# Patient Record
Sex: Female | Born: 1995 | Hispanic: Yes | Marital: Married | Smoking: Never smoker
Health system: Southern US, Community
[De-identification: ages and names within clinical notes are randomized; demographics above are authoritative.]

## PROBLEM LIST (undated history)

## (undated) DIAGNOSIS — O24419 Gestational diabetes mellitus in pregnancy, unspecified control: Secondary | ICD-10-CM

## (undated) DIAGNOSIS — Z8759 Personal history of other complications of pregnancy, childbirth and the puerperium: Secondary | ICD-10-CM

## (undated) HISTORY — PX: BREAST SURGERY: SHX581

## (undated) HISTORY — DX: Personal history of other complications of pregnancy, childbirth and the puerperium: Z87.59

## (undated) HISTORY — DX: Gestational diabetes mellitus in pregnancy, unspecified control: O24.419

## (undated) HISTORY — PX: OTHER SURGICAL HISTORY: SHX169

---
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
## 2019-03-04 NOTE — L&D Delivery Note (Signed)
Delivery Note At 1940 a viable female infant was delivered via SVD, presentation: LOA. APGAR: 9, 9; weight pending.   Placenta status: spontaneously delivered intact with gentle cord traction. Fundus firm with massage and Pitocin.   Anesthesia: none Lacerations: left labial-hemostatic Est. Blood Loss (mL): 25 Placenta to LD Complications none Cord ph n/a   Mom to postpartum. Baby to Couplet care / Skin to Skin.    Donette Larry, CNM 12/16/2019 7:59 PM

## 2019-08-25 ENCOUNTER — Other Ambulatory Visit: Payer: Self-pay | Admitting: Nurse Practitioner

## 2019-08-25 DIAGNOSIS — Z363 Encounter for antenatal screening for malformations: Secondary | ICD-10-CM

## 2019-08-25 LAB — OB RESULTS CONSOLE HEPATITIS B SURFACE ANTIGEN: Hepatitis B Surface Ag: NEGATIVE

## 2019-08-25 LAB — OB RESULTS CONSOLE RPR: RPR: NONREACTIVE

## 2019-08-25 LAB — OB RESULTS CONSOLE ABO/RH: RH Type: POSITIVE

## 2019-08-25 LAB — OB RESULTS CONSOLE ANTIBODY SCREEN: Antibody Screen: NEGATIVE

## 2019-08-25 LAB — SICKLE CELL SCREEN: Sickle Cell Screen: NORMAL

## 2019-08-25 LAB — OB RESULTS CONSOLE HIV ANTIBODY (ROUTINE TESTING): HIV: NONREACTIVE

## 2019-08-25 LAB — OB RESULTS CONSOLE GC/CHLAMYDIA
Chlamydia: NEGATIVE
Gonorrhea: NEGATIVE

## 2019-08-25 LAB — CYSTIC FIBROSIS DIAGNOSTIC STUDY: Interpretation-CFDNA:: NEGATIVE

## 2019-08-25 LAB — HEPATITIS C ANTIBODY: HCV Ab: NEGATIVE

## 2019-08-25 LAB — GLUCOSE TOLERANCE, 1 HOUR: Glucose, 1 Hour GTT: 179

## 2019-08-25 LAB — OB RESULTS CONSOLE HGB/HCT, BLOOD
HCT: 31 (ref 29–41)
Hemoglobin: 10.5

## 2019-08-25 LAB — OB RESULTS CONSOLE PLATELET COUNT: Platelets: 296

## 2019-08-25 LAB — OB RESULTS CONSOLE RUBELLA ANTIBODY, IGM: Rubella: IMMUNE

## 2019-08-26 ENCOUNTER — Encounter: Payer: Self-pay | Admitting: *Deleted

## 2019-08-30 ENCOUNTER — Ambulatory Visit: Payer: Self-pay

## 2019-08-30 ENCOUNTER — Other Ambulatory Visit: Payer: Self-pay | Admitting: *Deleted

## 2019-08-30 ENCOUNTER — Ambulatory Visit: Payer: Self-pay | Attending: Obstetrics and Gynecology

## 2019-08-30 ENCOUNTER — Other Ambulatory Visit: Payer: Self-pay | Admitting: Nurse Practitioner

## 2019-08-30 ENCOUNTER — Other Ambulatory Visit: Payer: Self-pay

## 2019-08-30 DIAGNOSIS — Z3687 Encounter for antenatal screening for uncertain dates: Secondary | ICD-10-CM

## 2019-08-30 DIAGNOSIS — O283 Abnormal ultrasonic finding on antenatal screening of mother: Secondary | ICD-10-CM

## 2019-08-30 DIAGNOSIS — Z3689 Encounter for other specified antenatal screening: Secondary | ICD-10-CM

## 2019-08-30 DIAGNOSIS — Z363 Encounter for antenatal screening for malformations: Secondary | ICD-10-CM | POA: Insufficient documentation

## 2019-08-30 DIAGNOSIS — O24419 Gestational diabetes mellitus in pregnancy, unspecified control: Secondary | ICD-10-CM

## 2019-08-30 DIAGNOSIS — Z3A24 24 weeks gestation of pregnancy: Secondary | ICD-10-CM

## 2019-08-30 LAB — GLUCOSE TOLERANCE, 3 HOURS
Glucose 1 Hour: 221
Glucose 2 Hour: 204
Glucose 3 Hour: 153
Glucose Fasting: 100

## 2019-09-01 LAB — CYTOLOGY - PAP: Pap: NEGATIVE

## 2019-09-16 ENCOUNTER — Other Ambulatory Visit: Payer: Self-pay

## 2019-09-16 ENCOUNTER — Encounter: Payer: Self-pay | Admitting: Obstetrics and Gynecology

## 2019-09-16 ENCOUNTER — Other Ambulatory Visit (HOSPITAL_COMMUNITY)
Admission: RE | Admit: 2019-09-16 | Discharge: 2019-09-16 | Disposition: A | Discharge status: Home / Self Care | Payer: Self-pay | Source: Ambulatory Visit | Attending: Obstetrics and Gynecology | Admitting: Obstetrics and Gynecology

## 2019-09-16 ENCOUNTER — Ambulatory Visit (INDEPENDENT_AMBULATORY_CARE_PROVIDER_SITE_OTHER): Payer: Self-pay | Admitting: Obstetrics and Gynecology

## 2019-09-16 VITALS — BP 103/61 | HR 90 | Wt 129.9 lb

## 2019-09-16 DIAGNOSIS — O35EXX0 Maternal care for other (suspected) fetal abnormality and damage, fetal genitourinary anomalies, not applicable or unspecified: Secondary | ICD-10-CM

## 2019-09-16 DIAGNOSIS — N898 Other specified noninflammatory disorders of vagina: Secondary | ICD-10-CM | POA: Insufficient documentation

## 2019-09-16 DIAGNOSIS — O23592 Infection of other part of genital tract in pregnancy, second trimester: Secondary | ICD-10-CM

## 2019-09-16 DIAGNOSIS — Z8759 Personal history of other complications of pregnancy, childbirth and the puerperium: Secondary | ICD-10-CM

## 2019-09-16 DIAGNOSIS — Z603 Acculturation difficulty: Secondary | ICD-10-CM

## 2019-09-16 DIAGNOSIS — O283 Abnormal ultrasonic finding on antenatal screening of mother: Secondary | ICD-10-CM

## 2019-09-16 DIAGNOSIS — O24410 Gestational diabetes mellitus in pregnancy, diet controlled: Secondary | ICD-10-CM | POA: Insufficient documentation

## 2019-09-16 DIAGNOSIS — O98812 Other maternal infectious and parasitic diseases complicating pregnancy, second trimester: Secondary | ICD-10-CM

## 2019-09-16 DIAGNOSIS — N738 Other specified female pelvic inflammatory diseases: Secondary | ICD-10-CM

## 2019-09-16 DIAGNOSIS — O358XX0 Maternal care for other (suspected) fetal abnormality and damage, not applicable or unspecified: Secondary | ICD-10-CM | POA: Insufficient documentation

## 2019-09-16 DIAGNOSIS — Z789 Other specified health status: Secondary | ICD-10-CM

## 2019-09-16 DIAGNOSIS — B9689 Other specified bacterial agents as the cause of diseases classified elsewhere: Secondary | ICD-10-CM

## 2019-09-16 DIAGNOSIS — O24419 Gestational diabetes mellitus in pregnancy, unspecified control: Secondary | ICD-10-CM

## 2019-09-16 DIAGNOSIS — O0992 Supervision of high risk pregnancy, unspecified, second trimester: Secondary | ICD-10-CM

## 2019-09-16 DIAGNOSIS — Z23 Encounter for immunization: Secondary | ICD-10-CM

## 2019-09-16 DIAGNOSIS — O0990 Supervision of high risk pregnancy, unspecified, unspecified trimester: Secondary | ICD-10-CM | POA: Insufficient documentation

## 2019-09-16 DIAGNOSIS — B379 Candidiasis, unspecified: Secondary | ICD-10-CM

## 2019-09-16 DIAGNOSIS — Z3A26 26 weeks gestation of pregnancy: Secondary | ICD-10-CM

## 2019-09-16 HISTORY — DX: Maternal care for other (suspected) fetal abnormality and damage, fetal genitourinary anomalies, not applicable or unspecified: O35.EXX0

## 2019-09-16 HISTORY — DX: Gestational diabetes mellitus in pregnancy, diet controlled: O24.410

## 2019-09-16 LAB — POCT URINALYSIS DIP (DEVICE)
Bilirubin Urine: NEGATIVE
Glucose, UA: NEGATIVE mg/dL
Hgb urine dipstick: NEGATIVE
Ketones, ur: NEGATIVE mg/dL
Leukocytes,Ua: NEGATIVE
Nitrite: NEGATIVE
Protein, ur: NEGATIVE mg/dL
Specific Gravity, Urine: 1.025 (ref 1.005–1.030)
Urobilinogen, UA: 0.2 mg/dL (ref 0.0–1.0)
pH: 7 (ref 5.0–8.0)

## 2019-09-16 NOTE — Progress Notes (Signed)
Pt reports green vaginal discharge. Denies vaginal odor or discomfort. Self-swab obtained.   Fleet Contras RN 09/16/19

## 2019-09-16 NOTE — Patient Instructions (Addendum)
AREA PEDIATRIC/FAMILY PRACTICE PHYSICIANS  [place] ([address]) . [facility] o Chambliss, MD; Eniola, MD; Hale, MD; Hensel, MD; McDiarmid, MD; McIntyer, MD; Neal, MD; Walden, MD o [address] [place], [place] [address] o [phone] o Mon-Fri 8:30-12:30, 1:30-5:00 o Providers come to see babies at [facility] o Accepting Medicaid . [facility] o Limited providers who accept newborns: Koirala, MD; Morrow, MD; Wolters, MD o [address], [place], [place] [address] o [phone] o Mon-Fri 8:00-5:30 o Babies seen by providers at [facility] o Does NOT accept Medicaid o Please call early in hospitalization for appointment (limited availability)  . [facility] o Mulberry, MD o [address] [place], [place] [address] o [phone] o Mon, Tue, Thur, Fri 8:30-5:00, Wed 10:00-7:00 (closed 1-2pm) o Babies seen by [facility] providers o Accepting Medicaid . [facility] - Pediatrician o Rubin, MD o [address], [place], [place] [address] o [phone] o Mon-Fri 8:30-5:00, Sat 8:30-12:00 o Provider comes to see babies at [facility] o Accepting Medicaid o Must have been referred from current patients or contacted office prior to delivery . [facility] ([facility]) o Brown, MD; Chandler, MD; Ettefagh, MD; Grant, MD; Lester, MD; McCormick, MD; McQueen, MD; Prose, MD; Simha, MD; Stanley, MD; Stryffeler, NP; Tebben, NP o [address], [place], [place] [address] o [phone] o Mon, Tue, Thur, Fri 8:30-5:30, Wed 9:30-5:30, Sat 8:30-12:30 o Babies seen by [facility] providers o Accepting Medicaid o Only accepting infants of first-time parents or siblings of current patients o Hospital discharge coordinator will make follow-up appointment . Jack Amos o [address],  [place], [place]  [address] o [phone]   Fax - [phone] . [facility] o [address], [place], [place]  [address] o Phone - [phone]   Fax - [phone] . Shilpa Gosrani o [address], [place], [place]  [address] o [phone]  [place] ([address]) . [facility] o Bates, MD; [place], MD; Cooper, Cox, MD; MD; Davis, MD; Dovico, MD; Ettefaugh, MD; Little, MD; Lowe, MD; Keiffer, MD; Melvin, MD; Sumner, MD; Williams, MD o [address], [place], [place] [address] o [phone] o Mon-Fri 8:30-5:00 (extended evenings Mon-Thur as needed), Sat-Sun 10:00-1:00 o Providers come to see babies at [facility] o Accepting Medicaid for families of first-time babies and families with all children in the household age 3 and under. Must register with office prior to making appointment (M-F only). . [facility] o Henson, NP; Knapp, MD; Lalonde, MD; Tysinger, PA o [address] [place], [place] [address] o [phone] o Mon-Fri 8:00-5:00 o Babies seen by providers at [facility] o Does NOT accept Medicaid/Commercial Insurance Only . [facility])  o Artis, MD; Barnes, MD; Bratton, MD; Coccaro, MD; Lockett Gardner, MD; Kramer, MD; Marshall, MD; Netherton, MD; Poleto, MD; Skinner, MD o [address] [place], [place] [address] o [phone] o Mon-Fri 8:30-5:30, Sat (Oct.-Mar.) 9:00-1:00 o Babies seen by providers at [facility] o Accepting Medicaid  [place] ([address]) . [facility] o Reid, MD; Warner, MD o [address], [place],  [address] o [phone] o Mon-Fri 8:30-5:00, Sat 8:30-12:00 o Providers come to see babies at [facility] o Does NOT accept Medicaid . [facility] o Becker, PA; Hagler, MD; Scifres, PA; Sun, MD; Swayne, MD o [address],  [place], [place] [address] o [phone] o Mon-Fri 8:00-5:00 o Babies seen by providers at [facility] o Does NOT accept Medicaid o Only accepting babies of parents who are patients o Please call early in hospitalization for appointment (limited availability) . [facility] o Clark, MD; Frye, MD; Kelleher, MD; Mack, NP; Miller, MD; O'Keller, MD; Patterson, NP; Pudlo, MD; Puzio, MD; Thomas, MD; Tucker, MD; Twiselton, MD o [address], [place], [place] [address] o [phone] o Mon-Fri 8:00-5:00, Sat 9:00-12:00 o Providers come to see babies at [facility] o Does NOT accept Medicaid  [place] ([address]) . [facility] o Limited providers accepting new patients: Brake, NP; Wharton, PA o [address], [place], [place] [address] o [phone] o Mon-Fri 8:00-5:00 o Babies seen by providers at [facility] o Does NOT accept Medicaid o Only accepting babies of parents who are patients o Please call early in hospitalization for appointment (limited availability) . [facility] o Gay, MD; Quinlan, MD o [address] [place], [place] [address] o [phone] (press 1 to schedule appointment) o Mon-Fri 8:00-5:00 o Providers come to see babies at [facility] o Does NOT accept Medicaid . [facility] o Mazer, MD o [address] [place], [place] [address] o [phone] o Mon-Fri 8:30-5:00 (lunch 12:30-1:00), extended hours by appointment only Wed 5:00-6:30 o Babies seen by [facility] providers o Accepting Medicaid . [facility] o Banks, MD; Jordan, MD; Koberlein, MD o [address], [place], [place] [address] o [phone] o Mon-Fri 8:00-5:00 o Babies seen by [facility] providers o Does NOT accept Medicaid . [facility] o Parker, MD; Hunter, MD; Wallace, DO o [address] [place], [place]  [address] o [phone] o Mon-Fri 8:00-5:00 o Babies seen by [facility] providers o Does NOT accept Medicaid . [facility] o Brandon, PA; Brecken, PA; Christy, NP; Dees, MD; DeClaire, MD; DeWeese, MD; Hansen, NP; Mills, NP; Parrish, NP; Smoot, NP; Summer, MD; Vapne, MD o [address] [place], [place] [address] o [phone] o Mon-Fri 8:30-5:00, Sat 10:00-1:00 o Providers come to see babies at [facility] o Does NOT accept Medicaid o Free prenatal information session Tuesdays at 4:45pm . [facility] o Bouska, MD; Gordon, PA; Jeffery, PA; Weber, PA o [address] [place] [address] o [phone] o Mon-Fri 7:30-5:30 o Babies seen by [facility] providers . [facility] o [address], [place], [place], [place]  [address] o [phone]   Fax - [phone]  [place] ([address]) . [facility] o Reese, MD o [address] [place], [place] [address] o [phone] o Mon-Thur 8:00-6:00 o Providers come to see babies at [facility] o Accepting Medicaid . [facility] o Anderson, NP; Badger, MD; Beal, PA; Spencer, PA o [address] [place],  [address] o [phone] o Mon-Thur 7:30-7:30, Fri 7:30-4:30 o Babies seen by [facility] providers o Accepting Medicaid . [facility] o Agbuya, MD; Klett, NP; Romgoolam, MD o [address], ,  [address] o [phone] o Mon-Fri 8:30-5:00, Sat 8:30-12:00 o Providers come to see babies at [facility] o Accepting Medicaid o Must have "Meet & Greet" appointment at office prior to delivery . [facility]  ([facility] ) o McCord,   MD; Earlene Plater, MD; Lucretia Roers, MD o [address], [place], [place] [address] o [phone] o Mon-Wed 8:00-6:00, Thur-Fri 8:00-5:00, Sat 9:00-12:00 o Providers come to  see babies at [facility] o Does NOT accept Medicaid o Only accepting siblings of current patients . [facility]  o [address], [place], [place]  [address] o [phone]   Fax - [phone] . [facility] o 608 642 [address] [place], [place]  [address] o [phone]   Fax - [phone]  [place] [phone] & [phone]) . Nature conservation officer at [facility] o Ballard, DO; [place], DO o [address] [place], [place] [address] o [phone] o Mon-Fri 7:00-5:00 o Babies seen by [facility] providers o Does NOT accept Medicaid . [facility] Ellis Savage, MD; [place], [place]; [place], [place] o [address], [place], [place] [address] o [phone] o Mon-Fri 8:00-5:00 o Babies seen by [facility] providers o Accepting Medicaid . [facility] [address] Franne Forts, MD; [place], [place]; [place], NP; [place], [place] o [address] [place], [place], [place] [address] o [phone] o Mon-Fri 8:00-5:00 o Babies seen by providers at [facility] o Accepting Medicaid      COVID-19 Vaccine Information can be found at: [email or web address] For questions related to vaccine distribution or appointments, please email [email or web address] or call [phone].

## 2019-09-16 NOTE — Progress Notes (Signed)
INITIAL PRENATAL VISIT NOTE  Subjective:  Andrea Vargas is a 24 y.o. G2P1001 at [redacted]w[redacted]d by 24 week [place] being seen today for her initial prenatal visit. She has an obstetric history significant for 1 x term spontaneous vaginal delivery. She has a medical history significant for n/a.  Transfer from [facility] for GDM.   Patient reports no complaints.  Contractions: Not present. Vag. Bleeding: None.  Movement: Present. Denies leaking of fluid.    Past Medical History:  Diagnosis Date  . History of gestational hypertension     Past Surgical History:  Procedure Laterality Date  . incision and drainage of breast     due to mastitis    OB History  Gravida Para Term Preterm AB Living  2 1 1  0 0 1  SAB TAB Ectopic Multiple Live Births  0 0 0 0 1    # Outcome Date GA Lbr Len/2nd Weight Sex Delivery Anes PTL Lv  2 Current           1 Term 2016   6 lb 9.8 oz (3 kg)  Vag-Spont       Social History   Socioeconomic History  . Marital status: Single    Spouse name: Not on file  . Number of children: Not on file  . Years of education: Not on file  . Highest education level: Not on file  Occupational History  . Not on file  Tobacco Use  . Smoking status: Never Smoker  . Smokeless tobacco: Never Used  Vaping Use  . Vaping Use: Never used  Substance and Sexual Activity  . Alcohol use: Never  . Drug use: Never  . Sexual activity: Not Currently  Other Topics Concern  . Not on file  Social History Narrative  . Not on file   Social Determinants of Health   Financial Resource Strain:   . Difficulty of Paying Living Expenses:   Food Insecurity:   . Worried About 2017 in the Last Year:   . Programme researcher, broadcasting/film/video in the Last Year:   Transportation Needs:   . Barista (Medical):   Freight forwarder Lack of Transportation (Non-Medical):   Physical Activity:   . Days of Exercise per Week:   . Minutes of Exercise per Session:   Stress:   . Feeling of Stress :   Social  Connections:   . Frequency of Communication with Friends and Family:   . Frequency of Social Gatherings with Friends and Family:   . Attends Religious Services:   . Active Member of Clubs or Organizations:   . Attends [employer] Meetings:   Banker Marital Status:     Family History  Problem Relation Age of Onset  . Diabetes Mother      Current Outpatient Medications:  .  Prenatal Vit-Fe Fumarate-FA (PRENATAL VITAMINS PO), Take 1 tablet by mouth daily., Disp: , Rfl:   Not on File  Review of Systems: Negative except for what is mentioned in HPI.  Objective:   Vitals:   09/16/19 0927  BP: 103/61  Pulse: 90  Weight: 129 lb 14.4 oz (58.9 kg)    Fetal Status: Fetal Heart Rate (bpm): 152   Movement: Present     Physical Exam: BP 103/61   Pulse 90   Wt 129 lb 14.4 oz (58.9 kg)   LMP 02/17/2019  CONSTITUTIONAL: Well-developed, well-nourished female in no acute distress.  NEUROLOGIC: Alert and oriented to person, place, and time. Normal  reflexes, muscle tone coordination. No cranial nerve deficit noted. PSYCHIATRIC: Normal mood and affect. Normal behavior. Normal judgment and thought content. SKIN: Skin is warm and dry. No rash noted. Not diaphoretic. No erythema. No pallor. HENT:  Normocephalic, atraumatic, External right and left ear normal. Oropharynx is clear and moist EYES: Conjunctivae and EOM are normal. Pupils are equal, round, and reactive to light. No scleral icterus.  NECK: Normal range of motion, supple, no masses CARDIOVASCULAR: Normal heart rate noted, regular rhythm RESPIRATORY: Effort and breath sounds normal, no problems with respiration noted BREASTS: deferred ABDOMEN: Soft, nondistended, gravid. GU: deferred MUSCULOSKELETAL: Normal range of motion. EXT:  No edema and no tenderness. 2+ distal pulses.   Assessment and Plan:  Pregnancy: G2P1001 at [redacted]w[redacted]d by 24 weeks [place]  1. Supervision of high risk pregnancy, antepartum - Reviewed [facility] [employer] structure, multiple providers, fellows, medical students, virtual visits, MyChart.  - Patient has not had COVID vaccine. Reviewed risks/benefits in pregnancy/breastfeeding and gave info. She will consider and call with questions.  2. Gestational diabetes mellitus (GDM), antepartum, gestational diabetes method of control unspecified Referral to diabetic educator made  3. Vaginal discharge - Cervicovaginal ancillary only( [place])  4. Language barrier Spanish translator used  5. Abnormal fetal ultrasound Dilated left UTD   Preterm labor symptoms and general obstetric precautions including but not limited to vaginal bleeding, contractions, leaking of fluid and fetal movement were reviewed in detail with the patient.  Please refer to After Visit Summary for other counseling recommendations.   Return in about 2 weeks (around 09/30/2019) for high OB, in person.  Conan Bowens 09/16/2019 10:48 AM

## 2019-09-19 LAB — CERVICOVAGINAL ANCILLARY ONLY
Bacterial Vaginitis (gardnerella): POSITIVE — AB
Candida Glabrata: POSITIVE — AB
Candida Vaginitis: NEGATIVE
Chlamydia: NEGATIVE
Comment: NEGATIVE
Comment: NEGATIVE
Comment: NEGATIVE
Comment: NEGATIVE
Comment: NEGATIVE
Comment: NORMAL
Neisseria Gonorrhea: NEGATIVE
Trichomonas: NEGATIVE

## 2019-09-19 MED ORDER — CLOTRIMAZOLE 1 % VA CREA
1.0000 | TOPICAL_CREAM | Freq: Every day | VAGINAL | 2 refills | Status: DC
Start: 2019-09-19 — End: 2019-10-03

## 2019-09-19 MED ORDER — METRONIDAZOLE 500 MG PO TABS
500.0000 mg | ORAL_TABLET | Freq: Two times a day (BID) | ORAL | 0 refills | Status: DC
Start: 2019-09-19 — End: 2019-10-03

## 2019-09-20 ENCOUNTER — Telehealth: Payer: Self-pay

## 2019-09-20 NOTE — Telephone Encounter (Signed)
-----   Message from Granville Lewis, RN sent at 09/19/2019  4:30 PM EDT ----- Karl Luke, Dr Earlene Plater printed this pt's RX because she didn't have a pharmacy listed.  If she sends back a message as to what pharmacy, let me know and I'll fax the RX to them.  Thanks and have a blessed day.

## 2019-09-20 NOTE — Telephone Encounter (Signed)
Called Pt to get the name of her pharmacy using Spanish Interpreter Patricio id # [phone] from [employer], pt stated pharmacy is [facility]. Chart has been updated.

## 2019-09-26 ENCOUNTER — Telehealth: Payer: Self-pay | Admitting: *Deleted

## 2019-09-26 NOTE — Telephone Encounter (Addendum)
-----   Message from Conan Bowens, MD sent at 09/19/2019  2:39 PM EDT ----- +BV and yeast, Rx sent to pharmacy, message sent via mychart, please call and inform her and ask patient what pharmacy she wants me to sent to, Spanish speaking  7/26  1405  Called pt with [facility] interpreter [phone] and informed her of recent test results confirming vaginal infections which are common - not sexually transmitted. Pt stated that she still has the 2 prescriptions which were provided last week. I advised her to take them to her pharmacy and to take/use the medications as directed. Pt voiced understanding and had no questions.

## 2019-09-27 ENCOUNTER — Other Ambulatory Visit: Payer: Self-pay

## 2019-09-27 ENCOUNTER — Ambulatory Visit: Payer: Self-pay

## 2019-09-27 ENCOUNTER — Ambulatory Visit: Payer: Self-pay | Admitting: *Deleted

## 2019-09-27 ENCOUNTER — Other Ambulatory Visit: Payer: Self-pay | Admitting: *Deleted

## 2019-09-27 ENCOUNTER — Ambulatory Visit: Payer: Self-pay | Attending: Obstetrics and Gynecology

## 2019-09-27 VITALS — BP 103/58 | HR 90

## 2019-09-27 DIAGNOSIS — O283 Abnormal ultrasonic finding on antenatal screening of mother: Secondary | ICD-10-CM | POA: Insufficient documentation

## 2019-09-27 DIAGNOSIS — O24410 Gestational diabetes mellitus in pregnancy, diet controlled: Secondary | ICD-10-CM | POA: Insufficient documentation

## 2019-09-27 DIAGNOSIS — O24419 Gestational diabetes mellitus in pregnancy, unspecified control: Secondary | ICD-10-CM

## 2019-09-27 DIAGNOSIS — Z3A28 28 weeks gestation of pregnancy: Secondary | ICD-10-CM

## 2019-09-27 DIAGNOSIS — O0990 Supervision of high risk pregnancy, unspecified, unspecified trimester: Secondary | ICD-10-CM | POA: Insufficient documentation

## 2019-09-27 DIAGNOSIS — Z362 Encounter for other antenatal screening follow-up: Secondary | ICD-10-CM

## 2019-09-27 DIAGNOSIS — O09293 Supervision of pregnancy with other poor reproductive or obstetric history, third trimester: Secondary | ICD-10-CM

## 2019-09-27 DIAGNOSIS — Z3689 Encounter for other specified antenatal screening: Secondary | ICD-10-CM | POA: Insufficient documentation

## 2019-09-28 DIAGNOSIS — O0990 Supervision of high risk pregnancy, unspecified, unspecified trimester: Secondary | ICD-10-CM

## 2019-09-28 DIAGNOSIS — O283 Abnormal ultrasonic finding on antenatal screening of mother: Secondary | ICD-10-CM

## 2019-09-28 DIAGNOSIS — O24419 Gestational diabetes mellitus in pregnancy, unspecified control: Secondary | ICD-10-CM

## 2019-09-29 ENCOUNTER — Ambulatory Visit: Payer: Self-pay | Admitting: Registered"

## 2019-09-29 ENCOUNTER — Encounter: Payer: Self-pay | Attending: Obstetrics and Gynecology | Admitting: Registered"

## 2019-09-29 ENCOUNTER — Encounter: Payer: Self-pay | Admitting: *Deleted

## 2019-09-29 ENCOUNTER — Other Ambulatory Visit: Payer: Self-pay

## 2019-09-29 DIAGNOSIS — Z3A00 Weeks of gestation of pregnancy not specified: Secondary | ICD-10-CM | POA: Insufficient documentation

## 2019-09-29 DIAGNOSIS — O24419 Gestational diabetes mellitus in pregnancy, unspecified control: Secondary | ICD-10-CM | POA: Insufficient documentation

## 2019-09-30 ENCOUNTER — Encounter: Payer: Self-pay | Admitting: *Deleted

## 2019-09-30 NOTE — Progress Notes (Signed)
Interpreter services provided by Azalee Course [phone] from [employer] language services  Patient does not have a car. RD advised patient to call ahead of time to request a ride to appointment if she is not able to arrange. Another barrier to care is arranging childcare.  Patient was seen on 09/30/19 for Gestational Diabetes self-management. EDD 12/20/19. Patient states no history of GDM. Diet history obtained. Patient limited variety of all food groups. Patient has been afraid to eat much food since diagnosis. Beverages include water.    The following learning objectives were met by the patient :   States the definition of Gestational Diabetes  States why dietary management is important in controlling blood glucose  Describes the effects of carbohydrates on blood glucose levels  Demonstrates ability to create a balanced meal plan  Demonstrates carbohydrate counting   States when to check blood glucose levels  Demonstrates proper blood glucose monitoring techniques  States the effect of stress and exercise on blood glucose levels  States the importance of limiting caffeine and abstaining from alcohol and smoking  Plan:  Aim for 3 Carbohydrate Choices per meal (45 grams) +/- 1 either way  Aim for 1-2 Carbohydrate Choices per snack Begin reading food labels for Total Carbohydrate of foods If OK with your MD, consider  increasing your activity level by walking, Arm Chair Exercises or other activity daily as tolerated Begin checking Blood Glucose before breakfast and 2 hours after first bite of breakfast, lunch and dinner as directed by MD  Bring Log Book/Sheet and meter to every medical appointment  Baby Scripts: Patient not appropriate for Baby Scripts due to  Take medication if directed by MD  Blood glucose monitor given: [employer] Lot # [id number] Blood glucose reading: 92 mg/dL (fasting)  Patient instructed to monitor glucose levels: FBS: 60 - 95 mg/dl 2 hour: <120 mg/dl  Patient received  the following handouts:  Nutrition Diabetes and Pregnancy  Carbohydrate Counting List  Blood glucose Log Sheet  Patient will be seen for follow-up in 2 weeks or as needed.

## 2019-10-03 ENCOUNTER — Other Ambulatory Visit: Payer: Self-pay

## 2019-10-03 ENCOUNTER — Ambulatory Visit (INDEPENDENT_AMBULATORY_CARE_PROVIDER_SITE_OTHER): Payer: Self-pay | Admitting: Obstetrics and Gynecology

## 2019-10-03 VITALS — BP 105/61 | HR 95 | Wt 132.4 lb

## 2019-10-03 DIAGNOSIS — O24419 Gestational diabetes mellitus in pregnancy, unspecified control: Secondary | ICD-10-CM

## 2019-10-03 DIAGNOSIS — O0990 Supervision of high risk pregnancy, unspecified, unspecified trimester: Secondary | ICD-10-CM

## 2019-10-03 NOTE — Progress Notes (Deleted)
   PRENATAL VISIT NOTE  Subjective:  Andrea Vargas is a 24 y.o. G2P1001 at [redacted]w[redacted]d being seen today for ongoing prenatal care.  She is currently monitored for the following issues for this {Blank single:19197::"high-risk","low-risk"} pregnancy and has Supervision of high risk pregnancy, antepartum; Gestational diabetes; Abnormal fetal ultrasound; and History of gestational hypertension on their problem list.  Patient reports {sx:14538}.  Contractions: Not present. Vag. Bleeding: None.  Movement: Present. Denies leaking of fluid.   The following portions of the patient's history were reviewed and updated as appropriate: allergies, current medications, past family history, past medical history, past social history, past surgical history and problem list.   Objective:   Vitals:   10/03/19 0847  BP: (!) 105/61  Pulse: 95  Weight: 132 lb 6.4 oz (60.1 kg)    Fetal Status: Fetal Heart Rate (bpm): 154   Movement: Present     General:  Alert, oriented and cooperative. Patient is in no acute distress.  Skin: Skin is warm and dry. No rash noted.   Cardiovascular: Normal heart rate noted  Respiratory: Normal respiratory effort, no problems with respiration noted  Abdomen: Soft, gravid, appropriate for gestational age.  Pain/Pressure: Absent     Pelvic: {Blank single:19197::"Cervical exam performed in the presence of a chaperone","Cervical exam deferred"}        Extremities: Normal range of motion.  Edema: None  Mental Status: Normal mood and affect. Normal behavior. Normal judgment and thought content.   Assessment and Plan:  Pregnancy: G2P1001 at [redacted]w[redacted]d There are no diagnoses linked to this encounter. {Blank single:19197::"Term","Preterm"} labor symptoms and general obstetric precautions including but not limited to vaginal bleeding, contractions, leaking of fluid and fetal movement were reviewed in detail with the patient. Please refer to After Visit Summary for other counseling  recommendations.   No follow-ups on file.  Future Appointments  Date Time Provider Department Center  10/03/2019  8:55 AM Hermina Staggers, MD [facility]  10/13/2019  8:15 AM [facility]  10/25/2019  7:15 AM [facility] NURSE [facility]  10/25/2019  7:30 AM [facility] US3 [facility]    Gita Kudo, MD

## 2019-10-03 NOTE — Progress Notes (Signed)
   PRENATAL VISIT NOTE  Subjective:  Andrea Vargas is a 24 y.o. G2P1001 at [redacted]w[redacted]d being seen today for ongoing prenatal care.  She is currently monitored for the following issues for this high-risk pregnancy and has Supervision of high risk pregnancy, antepartum; Gestational diabetes; Abnormal fetal ultrasound; and History of gestational hypertension on their problem list.  Patient reports no complaints.  Contractions: Not present. Vag. Bleeding: None.  Movement: Present. Denies leaking of fluid.   The following portions of the patient's history were reviewed and updated as appropriate: allergies, current medications, past family history, past medical history, past social history, past surgical history and problem list.   Objective:   Vitals:   10/03/19 0847  BP: (!) 105/61  Pulse: 95  Weight: 132 lb 6.4 oz (60.1 kg)    Fetal Status: Fetal Heart Rate (bpm): 154   Movement: Present     General:  Alert, oriented and cooperative. Patient is in no acute distress.  Skin: Skin is warm and dry. No rash noted.   Cardiovascular: Normal heart rate noted  Respiratory: Normal respiratory effort, no problems with respiration noted  Abdomen: Soft, gravid, appropriate for gestational age.  Pain/Pressure: Absent     Pelvic: Cervical exam deferred        Extremities: Normal range of motion.  Edema: None  Mental Status: Normal mood and affect. Normal behavior. Normal judgment and thought content.   Assessment and Plan:  Pregnancy: G2P1001 at [redacted]w[redacted]d 1. Gestational diabetes mellitus (GDM) in third trimester, gestational diabetes method of control unspecified - reviewed log, all glucose levels are at goal for fasting and postprandial - encouraged to keep up great work w diet control - had 28 week growth, due next at 32 weeks, patient has already scheduled  2. Supervision of high risk pregnancy, antepartum - reviewed all initial labwork (done at ~26 weeks). HCV, HIV neg. Rubella immune. All normal  labs.  - received TDAP already  Discussed Covid Vaccination and patient is interested in obtaining. Provided with resources for finding local free clinic.   Preterm labor symptoms and general obstetric precautions including but not limited to vaginal bleeding, contractions, leaking of fluid and fetal movement were reviewed in detail with the patient. Please refer to After Visit Summary for other counseling recommendations.   Return in about 2 weeks (around 10/17/2019) for rOB.  Future Appointments  Date Time Provider Department Center  10/13/2019  8:15 AM [facility]  10/19/2019  9:55 AM Reva Bores, MD [facility]  10/25/2019  7:15 AM [facility] NURSE [facility]  10/25/2019  7:30 AM [facility] US3 [facility]    Gita Kudo, MD

## 2019-10-03 NOTE — Progress Notes (Signed)
Pt has Glucose readings on Paper. 

## 2019-10-03 NOTE — Patient Instructions (Addendum)
Tercer trimestre de embarazo Third Trimester of Pregnancy  El tercer trimestre comprende desde la semana28 hasta la semana40 (desde el mes7 hasta el mes9). En este trimestre, el beb en gestacin (feto) crece muy rpidamente. Hacia el final del noveno mes, el beb en gestacin mide alrededor de 20pulgadas (45cm) de largo. Pesa entre 6y 10libras (2,70y 4,50kg). Siga estas indicaciones en su casa: Medicamentos  Tome los medicamentos de venta libre y los recetados solamente como se lo haya indicado el mdico. Algunos medicamentos son seguros para tomar durante el embarazo y otros no lo son.  Tome vitaminas prenatales que contengan por lo menos 600microgramos (?g) de cido flico.  Si tiene dificultad para mover el intestino (estreimiento), tome un medicamento para ablandar las heces (laxante) si su mdico se lo autoriza. Comida y bebida   Ingiera alimentos saludables de manera regular.  No coma carne cruda ni quesos sin cocinar.  Si obtiene poca cantidad de calcio de los alimentos que ingiere, consulte a su mdico sobre la posibilidad de tomar un suplemento diario de calcio.  La ingesta diaria de cuatro o cinco comidas pequeas en lugar de tres comidas abundantes.  Evite el consumo de alimentos ricos en grasas y azcares, como los alimentos fritos y los dulces.  Para evitar el estreimiento: ? Consuma alimentos ricos en fibra, como frutas y verduras frescas, cereales integrales y frijoles. ? Beba suficiente lquido para mantener el pis (orina) claro o de color amarillo plido. Actividad  Haga ejercicios solamente como se lo haya indicado el mdico. Interrumpa la actividad fsica si comienza a tener calambres.  No levante objetos pesados, use zapatos de tacones bajos y sintese derecha.  No haga ejercicio si hace demasiado calor, hay demasiada humedad o se encuentra en un lugar de mucha altura (altitud alta).  Puede continuar teniendo relaciones sexuales, a menos que el  mdico le indique lo contrario. Alivio del dolor y del malestar  Use un sostn que le brinde buen soporte si sus mamas estn sensibles.  Haga pausas frecuentes y descanse con las piernas levantadas si tiene calambres en las piernas o dolor en la zona lumbar.  Dese baos de asiento con agua tibia para aliviar el dolor o las molestias causadas por las hemorroides. Use una crema para las hemorroides si el mdico la autoriza.  Si desarrolla venas hinchadas y abultadas (vrices) en las piernas: ? Use medias de compresin o medias de descanso como se lo haya indicado el mdico. ? Levante (eleve) los pies durante 15minutos, 3 o 4veces por da. ? Limite el consumo de sal en sus alimentos. Seguridad  Colquese el cinturn de seguridad cuando conduzca.  Haga una lista de los nmeros de telfono de emergencia, que incluya los nmeros de telfono de familiares, amigos, el hospital, as como los departamentos de polica y bomberos. Preparacin para la llegada del beb Para prepararse para la llegada de su beb:  Tome clases prenatales.  Practique ir manejando al hospital.  Visite el hospital y recorra el rea de maternidad.  Hable en su trabajo acerca de tomar licencia cuando llegue el beb.  Prepare el bolso que llevar al hospital.  Prepare la habitacin del beb.  Concurra a los controles mdicos.  Compre un asiento de seguridad orientado hacia atrs para llevar al beb en el automvil. Aprenda cmo instalarlo en el auto. Instrucciones generales  No se d baos de inmersin en agua caliente, baos turcos ni saunas.  No consuma ningn producto que contenga nicotina o tabaco, como cigarrillos y cigarrillos   electrnicos. Si necesita ayuda para dejar de fumar, consulte al mdico.  No beba alcohol.  No se haga duchas vaginales ni use tampones o toallas higinicas perfumadas.  No mantenga las piernas cruzadas durante mucho tiempo.  No haga viajes de larga distancia, excepto si es  obligatorio. Hgalos solamente si su mdico la autoriza.  Visite a su dentista si no lo ha hecho durante el embarazo. Use un cepillo de cerdas suaves para cepillarse los dientes. Psese el hilo dental con suavidad.  Evite el contacto con las bandejas sanitarias de los gatos y la tierra que estos animales usan. Estos elementos contienen bacterias que pueden causar defectos congnitos al beb y la posible prdida del beb (aborto espontneo) o la muerte fetal.  Concurra a todas las visitas prenatales como se lo haya indicado el mdico. Esto es importante. Comunquese con un mdico si:  No est segura de si est en trabajo de parto o si ha roto la bolsa de las aguas.  Tiene mareos.  Tiene clicos leves o siente presin en la parte baja del vientre.  Sufre un dolor persistente en el abdomen.  Sigue teniendo malestar estomacal, vomita o tiene heces lquidas.  Advierte un lquido con olor ftido que proviene de la vagina.  Siente dolor al orinar. Solicite ayuda de inmediato si:  Tiene fiebre.  Tiene una prdida de lquido por la vagina.  Tiene sangrado o pequeas prdidas vaginales.  Siente dolor intenso o clicos en el abdomen.  Aumenta o baja de peso rpidamente.  Tiene dificultades para recuperar el aliento y siente dolor en el pecho.  Sbitamente se le hinchan mucho el rostro, las manos, los tobillos, los pies o las piernas.  No ha sentido los movimientos del beb durante una hora.  Siente un dolor de cabeza intenso que no se alivia con medicamentos.  Tiene dificultad para ver.  Tiene prdida de lquido o le sale un chorro de lquido de la vagina antes de estar en la semana 37.  Tiene espasmos abdominales (contracciones) regulares antes de estar en la semana 37. Resumen  El tercer trimestre comprende desde la semana28 hasta la semana40 (desde el mes7 hasta el mes9). Esta es la poca en que el beb en gestacin crece muy rpidamente.  Siga los consejos del mdico  con respecto a los medicamentos, la alimentacin y la actividad.  Preprese para la llegada del beb tomando las clases prenatales, preparando todo lo que necesitar el beb, arreglando la habitacin del beb y concurriendo a los controles mdicos.  Solicite ayuda de inmediato si tiene sangrado por la vagina, siente dolor en el pecho o tiene dificultad para respirar, o si no ha sentido que su beb se mueve en el transcurso de ms de una hora. Esta informacin no tiene como fin reemplazar el consejo del mdico. Asegrese de hacerle al mdico cualquier pregunta que tenga. Document Revised: 09/22/2016 Document Reviewed: 09/22/2016 Elsevier Patient Education  2020 [employer]  

## 2019-10-04 ENCOUNTER — Other Ambulatory Visit: Payer: Self-pay | Admitting: *Deleted

## 2019-10-04 DIAGNOSIS — O24419 Gestational diabetes mellitus in pregnancy, unspecified control: Secondary | ICD-10-CM

## 2019-10-10 ENCOUNTER — Encounter: Payer: Self-pay | Admitting: *Deleted

## 2019-10-13 ENCOUNTER — Other Ambulatory Visit: Payer: Self-pay

## 2019-10-19 ENCOUNTER — Ambulatory Visit (INDEPENDENT_AMBULATORY_CARE_PROVIDER_SITE_OTHER): Payer: Self-pay | Admitting: Family Medicine

## 2019-10-19 ENCOUNTER — Other Ambulatory Visit: Payer: Self-pay

## 2019-10-19 VITALS — BP 101/70 | HR 102 | Wt 132.0 lb

## 2019-10-19 DIAGNOSIS — O24410 Gestational diabetes mellitus in pregnancy, diet controlled: Secondary | ICD-10-CM

## 2019-10-19 DIAGNOSIS — O0990 Supervision of high risk pregnancy, unspecified, unspecified trimester: Secondary | ICD-10-CM

## 2019-10-19 NOTE — Patient Instructions (Signed)
 Lactancia materna Breastfeeding  Decidir amamantar es una de las mejores elecciones que puede hacer por usted y su beb. Un cambio en las hormonas durante el embarazo hace que las mamas produzcan leche materna en las glndulas productoras de leche. Las hormonas impiden que la leche materna sea liberada antes del nacimiento del beb. Adems, impulsan el flujo de leche luego del nacimiento. Una vez que ha comenzado a amamantar, pensar en el beb, as como la succin o el llanto, pueden estimular la liberacin de leche de las glndulas productoras de leche. Los beneficios de amamantar Las investigaciones demuestran que la lactancia materna ofrece muchos beneficios de salud para bebs y madres. Adems, ofrece una forma gratuita y conveniente de alimentar al beb. Para el beb  La primera leche (calostro) ayuda a mejorar el funcionamiento del aparato digestivo del beb.  Las clulas especiales de la leche (anticuerpos) ayudan a combatir las infecciones en el beb.  Los bebs que se alimentan con leche materna tambin tienen menos probabilidades de tener asma, alergias, obesidad o diabetes de tipo 2. Adems, tienen menor riesgo de sufrir el sndrome de muerte sbita del lactante (SMSL).  Los nutrientes de la leche materna son mejores para satisfacer las necesidades del beb en comparacin con la leche maternizada.  La leche materna mejora el desarrollo cerebral del beb. Para usted  La lactancia materna favorece el desarrollo de un vnculo muy especial entre la madre y el beb.  Es conveniente. La leche materna es econmica y siempre est disponible a la temperatura correcta.  La lactancia materna ayuda a quemar caloras. Le ayuda a perder el peso ganado durante el embarazo.  Hace que el tero vuelva al tamao que tena antes del embarazo ms rpido. Adems, disminuye el sangrado (loquios) despus del parto.  La lactancia materna contribuye a reducir el riesgo de tener diabetes de tipo 2,  osteoporosis, artritis reumatoide, enfermedades cardiovasculares y cncer de mama, ovario, tero y endometrio en el futuro. Informacin bsica sobre la lactancia Comienzo de la lactancia  Encuentre un lugar cmodo para sentarse o acostarse, con un buen respaldo para el cuello y la espalda.  Coloque una almohada o una manta enrollada debajo del beb para acomodarlo a la altura de la mama (si est sentada). Las almohadas para amamantar se han diseado especialmente a fin de servir de apoyo para los brazos y el beb mientras amamanta.  Asegrese de que la barriga del beb (abdomen) est frente a la suya.  Masajee suavemente la mama. Con las yemas de los dedos, masajee los bordes exteriores de la mama hacia adentro, en direccin al pezn. Esto estimula el flujo de leche. Si la leche fluye lentamente, es posible que deba continuar con este movimiento durante la lactancia.  Sostenga la mama con 4 dedos por debajo y el pulgar por arriba del pezn (forme la letra "C" con la mano). Asegrese de que los dedos se encuentren lejos del pezn y de la boca del beb.  Empuje suavemente los labios del beb con el pezn o con el dedo.  Cuando la boca del beb se abra lo suficiente, acrquelo rpidamente a la mama e introduzca todo el pezn y la arola, tanto como sea posible, dentro de la boca del beb. La arola es la zona de color que rodea al pezn. ? Debe haber ms arola visible por arriba del labio superior del beb que por debajo del labio inferior. ? Los labios del beb deben estar abiertos y extendidos hacia afuera (evertidos) para asegurar   que el beb se prenda de forma adecuada y cmoda. ? La lengua del beb debe estar entre la enca inferior y la mama.  Asegrese de que la boca del beb est en la posicin correcta alrededor del pezn (prendido). Los labios del beb deben crear un sello sobre la mama y estar doblados hacia afuera (invertidos).  Es comn que el beb succione durante 2 a 3 minutos  para que comience el flujo de leche materna. Cmo debe prenderse Es muy importante que le ensee al beb cmo prenderse adecuadamente a la mama. Si el beb no se prende adecuadamente, puede causar dolor en los pezones, reducir la produccin de leche materna y hacer que el beb tenga un escaso aumento de peso. Adems, si el beb no se prende adecuadamente al pezn, puede tragar aire durante la alimentacin. Esto puede causarle molestias al beb. Hacer eructar al beb al cambiar de mama puede ayudarlo a liberar el aire. Sin embargo, ensearle al beb cmo prenderse a la mama adecuadamente es la mejor manera de evitar que se sienta molesto por tragar aire mientras se alimenta. Signos de que el beb se ha prendido adecuadamente al pezn  Tironea o succiona de modo silencioso, sin causarle dolor. Los labios del beb deben estar extendidos hacia afuera (evertidos).  Se escucha que traga cada 3 o 4 succiones una vez que la leche ha comenzado a fluir (despus de que se produzca el reflejo de eyeccin de la leche).  Hay movimientos musculares por arriba y por delante de sus odos al succionar. Signos de que el beb no se ha prendido adecuadamente al pezn  Hace ruidos de succin o de chasquido mientras se alimenta.  Siente dolor en los pezones. Si cree que el beb no se prendi correctamente, deslice el dedo en la comisura de la boca y colquelo entre las encas del beb para interrumpir la succin. Intente volver a comenzar a amamantar. Signos de lactancia materna exitosa Signos del beb  El beb disminuir gradualmente el nmero de succiones o dejar de succionar por completo.  El beb se quedar dormido.  El cuerpo del beb se relajar.  El beb retendr una pequea cantidad de leche en la boca.  El beb se desprender solo del pecho. Signos que presenta usted  Las mamas han aumentado la firmeza, el peso y el tamao 1 a 3 horas despus de amamantar.  Estn ms blandas inmediatamente despus  de amamantar.  Se producen un aumento del volumen de leche y un cambio en su consistencia y color hacia el quinto da de lactancia.  Los pezones no duelen, no estn agrietados ni sangran. Signos de que su beb recibe la cantidad de leche suficiente  Mojar por lo menos 1 o 2paales durante las primeras 24horas despus del nacimiento.  Mojar por lo menos 5 o 6paales cada 24horas durante la primera semana despus del nacimiento. La orina debe ser clara o de color amarillo plido a los 5das de vida.  Mojar entre 6 y 8paales cada 24horas a medida que el beb sigue creciendo y desarrollndose.  Defeca por lo menos 3 veces en 24 horas a los 5 das de vida. Las heces deben ser blandas y amarillentas.  Defeca por lo menos 3 veces en 24 horas a los 7 das de vida. Las heces deben ser grumosas y amarillentas.  No registra una prdida de peso mayor al 10% del peso al nacer durante los primeros 3 das de vida.  Aumenta de peso un promedio de 4   a 7onzas (113 a 198g) por semana despus de los 4 das de vida.  Aumenta de peso, diariamente, de manera uniforme a partir de los 5 das de vida, sin registrar prdida de peso despus de las 2semanas de vida. Despus de alimentarse, es posible que el beb regurgite una pequea cantidad de leche. Esto es normal. Frecuencia y duracin de la lactancia El amamantamiento frecuente la ayudar a producir ms leche y puede prevenir dolores en los pezones y las mamas extremadamente llenas (congestin mamaria). Alimente al beb cuando muestre signos de hambre o si siente la necesidad de reducir la congestin de las mamas. Esto se denomina "lactancia a demanda". Las seales de que el beb tiene hambre incluyen las siguientes:  Aumento del estado de alerta, actividad o inquietud.  Mueve la cabeza de un lado a otro.  Abre la boca cuando se le toca la mejilla o la comisura de la boca (reflejo de bsqueda).  Aumenta las vocalizaciones, tales como sonidos de  succin, se relame los labios, emite arrullos, suspiros o chirridos.  Mueve la mano hacia la boca y se chupa los dedos o las manos.  Est molesto o llora. Evite el uso del chupete en las primeras 4 a 6 semanas despus del nacimiento del beb. Despus de este perodo, podr usar un chupete. Las investigaciones demostraron que el uso del chupete durante el primer ao de vida del beb disminuye el riesgo de tener el sndrome de muerte sbita del lactante (SMSL). Permita que el nio se alimente en cada mama todo lo que desee. Cuando el beb se desprende o se queda dormido mientras se est alimentando de la primera mama, ofrzcale la segunda. Debido a que, con frecuencia, los recin nacidos estn somnolientos las primeras semanas de vida, es posible que deba despertar al beb para alimentarlo. Los horarios de lactancia varan de un beb a otro. Sin embargo, las siguientes reglas pueden servir como gua para ayudarla a garantizar que el beb se alimenta adecuadamente:  Se puede amamantar a los recin nacidos (bebs de 4 semanas o menos de vida) cada 1 a 3 horas.  No deben transcurrir ms de 3 horas durante el da o 5 horas durante la noche sin que se amamante a los recin nacidos.  Debe amamantar al beb un mnimo de 8 veces en un perodo de 24 horas. Extraccin de leche materna     La extraccin y el almacenamiento de la leche materna le permiten asegurarse de que el beb se alimente exclusivamente de su leche materna, aun en momentos en los que no puede amamantar. Esto tiene especial importancia si debe regresar al trabajo en el perodo en que an est amamantando o si no puede estar presente en los momentos en que el beb debe alimentarse. Su asesor en lactancia puede ayudarla a encontrar un mtodo de extraccin que funcione mejor para usted y orientarla sobre cunto tiempo es seguro almacenar leche materna. Cmo cuidar las mamas durante la lactancia Los pezones pueden secarse, agrietarse y doler  durante la lactancia. Las siguientes recomendaciones pueden ayudarla a mantener las mamas humectadas y sanas:  Evite usar jabn en los pezones.  Use un sostn de soporte diseado especialmente para la lactancia materna. Evite usar sostenes con aro o sostenes muy ajustados (sostenes deportivos).  Seque al aire sus pezones durante 3 a 4minutos despus de amamantar al beb.  Utilice solo apsitos de algodn en el sostn para absorber las prdidas de leche. La prdida de un poco de leche materna entre   las tomas es normal.  Utilice lanolina sobre los pezones luego de amamantar. La lanolina ayuda a mantener la humedad normal de la piel. La lanolina pura no es perjudicial (no es txica) para el beb. Adems, puede extraer manualmente algunas gotas de leche materna y masajear suavemente esa leche sobre los pezones para que la leche se seque al aire. Durante las primeras semanas despus del nacimiento, algunas mujeres experimentan congestin mamaria. La congestin mamaria puede hacer que sienta las mamas pesadas, calientes y sensibles al tacto. El pico de la congestin mamaria ocurre en el plazo de los 3 a 5 das despus del parto. Las siguientes recomendaciones pueden ayudarla a aliviar la congestin mamaria:  Vace por completo las mamas al amamantar o extraer leche. Puede aplicar calor hmedo en las mamas (en la ducha o con toallas hmedas para manos) antes de amamantar o extraer leche. Esto aumenta la circulacin y ayuda a que la leche fluya. Si el beb no vaca por completo las mamas cuando lo amamanta, extraiga la leche restante despus de que haya finalizado.  Aplique compresas de hielo sobre las mamas inmediatamente despus de amamantar o extraer leche, a menos que le resulte demasiado incmodo. Haga lo siguiente: ? Ponga el hielo en una bolsa plstica. ? Coloque una toalla entre la piel y la bolsa de hielo. ? Coloque el hielo durante 20minutos, 2 o 3veces por da.  Asegrese de que el beb  est prendido y se encuentre en la posicin correcta mientras lo alimenta. Si la congestin mamaria persiste luego de 48 horas o despus de seguir estas recomendaciones, comunquese con su mdico o un asesor en lactancia. Recomendaciones de salud general durante la lactancia  Consuma 3 comidas y 3 colaciones saludables todos los das. Las madres bien alimentadas que amamantan necesitan entre 450 y 500 caloras adicionales por da. Puede cumplir con este requisito al aumentar la cantidad de una dieta equilibrada que realice.  Beba suficiente agua para mantener la orina clara o de color amarillo plido.  Descanse con frecuencia, reljese y siga tomando sus vitaminas prenatales para prevenir la fatiga, el estrs y los niveles bajos de vitaminas y minerales en el cuerpo (deficiencias de nutrientes).  No consuma ningn producto que contenga nicotina o tabaco, como cigarrillos y cigarrillos electrnicos. El beb puede verse afectado por las sustancias qumicas de los cigarrillos que pasan a la leche materna y por la exposicin al humo ambiental del tabaco. Si necesita ayuda para dejar de fumar, consulte al mdico.  Evite el consumo de alcohol.  No consuma drogas ilegales o marihuana.  Antes de usar cualquier medicamento, hable con el mdico. Estos incluyen medicamentos recetados y de venta libre, como tambin vitaminas y suplementos a base de hierbas. Algunos medicamentos, que pueden ser perjudiciales para el beb, pueden pasar a travs de la leche materna.  Puede quedar embarazada durante la lactancia. Si se desea un mtodo anticonceptivo, consulte al mdico sobre cules son las opciones seguras durante la lactancia. Dnde encontrar ms informacin: Liga internacional [place]eche: www.[email or web address].org. Comunquese con un mdico si:  Siente que quiere dejar de amamantar o se siente frustrada con la lactancia.  Sus pezones estn agrietados o sangran.  Sus mamas estn irritadas, sensibles o  calientes.  Tiene los siguientes sntomas: ? Dolor en las mamas o en los pezones. ? Un rea hinchada en cualquiera de las mamas. ? Fiebre o escalofros. ? Nuseas o vmitos. ? Drenaje de otro lquido distinto de la leche materna desde los pezones.  Sus mamas no   se llenan antes de amamantar al beb para el quinto da despus del parto.  Se siente triste y deprimida.  El beb: ? Est demasiado somnoliento como para comer bien. ? Tiene problemas para dormir. ? Tiene ms de 1 semana de vida y moja menos de 6 paales en un periodo de 24 horas. ? No ha aumentado de peso a los 5 das de vida.  El beb defeca menos de 3 veces en 24 horas.  La piel del beb o las partes blancas de los ojos se vuelven amarillentas. Solicite ayuda de inmediato si:  El beb est muy cansado (letargo) y no se quiere despertar para comer.  Le sube la fiebre sin causa. Resumen  La lactancia materna ofrece muchos beneficios de salud para bebs y madres.  Intente amamantar a su beb cuando muestre signos tempranos de hambre.  Haga cosquillas o empuje suavemente los labios del beb con el dedo o el pezn para lograr que el beb abra la boca. Acerque el beb a la mama. Asegrese de que la mayor parte de la arola se encuentre dentro de la boca del beb. Ofrzcale una mama y haga eructar al beb antes de pasar a la otra.  Hable con su mdico o asesor en lactancia si tiene dudas o problemas con la lactancia. Esta informacin no tiene como fin reemplazar el consejo del mdico. Asegrese de hacerle al mdico cualquier pregunta que tenga. Document Revised: 05/14/2017 Document Reviewed: 06/09/2016 Elsevier Patient Education  2020 [employer]  

## 2019-10-19 NOTE — Progress Notes (Signed)
   PRENATAL VISIT NOTE  Subjective:  Tylena Prisk is a 24 y.o. G2P1001 at [redacted]w[redacted]d being seen today for ongoing prenatal care.  She is currently monitored for the following issues for this high-risk pregnancy and has Supervision of high risk pregnancy, antepartum; Gestational diabetes; Abnormal fetal ultrasound; and History of gestational hypertension on their problem list.  Patient reports no complaints.  Contractions: Not present. Vag. Bleeding: None.  Movement: Present. Denies leaking of fluid.   The following portions of the patient's history were reviewed and updated as appropriate: allergies, current medications, past family history, past medical history, past social history, past surgical history and problem list.   Objective:   Vitals:   10/19/19 1012  BP: 101/70  Pulse: (!) 102  Weight: 132 lb (59.9 kg)    Fetal Status: Fetal Heart Rate (bpm): 143 Fundal Height: 28 cm Movement: Present     General:  Alert, oriented and cooperative. Patient is in no acute distress.  Skin: Skin is warm and dry. No rash noted.   Cardiovascular: Normal heart rate noted  Respiratory: Normal respiratory effort, no problems with respiration noted  Abdomen: Soft, gravid, appropriate for gestational age.  Pain/Pressure: Absent     Pelvic: Cervical exam deferred        Extremities: Normal range of motion.  Edema: None  Mental Status: Normal mood and affect. Normal behavior. Normal judgment and thought content.   Assessment and Plan:  Pregnancy: G2P1001 at [redacted]w[redacted]d 1. Supervision of high risk pregnancy, antepartum Continue prenatal care. Feels breech today   2. Diet controlled gestational diabetes mellitus (GDM) in third trimester Media Information  CBGs are WNL on diet, continue this. Recent growth 43%  Preterm labor symptoms and general obstetric precautions including but not limited to vaginal bleeding, contractions, leaking of fluid and fetal movement were reviewed in detail with the  patient. Please refer to After Visit Summary for other counseling recommendations.   Return in 2 weeks (on 11/02/2019) for [facility], in person.  Future Appointments  Date Time Provider Department Center  10/25/2019  7:15 AM [facility] NURSE [facility]  10/25/2019  7:30 AM [facility] US3 [facility]  11/03/2019 10:35 AM Conan Bowens, MD [facility]  11/03/2019  2:15 PM [facility]  11/17/2019  8:15 AM Constant, Gigi Gin, MD [facility]  11/24/2019  8:15 AM Warden Fillers, MD [facility]  12/01/2019  8:15 AM Warden Fillers, MD [facility]    Reva Bores, MD

## 2019-10-25 ENCOUNTER — Other Ambulatory Visit: Payer: Self-pay

## 2019-10-25 ENCOUNTER — Ambulatory Visit: Payer: Self-pay | Attending: Obstetrics and Gynecology

## 2019-10-25 ENCOUNTER — Ambulatory Visit: Payer: Self-pay | Admitting: *Deleted

## 2019-10-25 ENCOUNTER — Encounter: Payer: Self-pay | Admitting: *Deleted

## 2019-10-25 DIAGNOSIS — Z3A32 32 weeks gestation of pregnancy: Secondary | ICD-10-CM

## 2019-10-25 DIAGNOSIS — O0990 Supervision of high risk pregnancy, unspecified, unspecified trimester: Secondary | ICD-10-CM

## 2019-10-25 DIAGNOSIS — O09293 Supervision of pregnancy with other poor reproductive or obstetric history, third trimester: Secondary | ICD-10-CM

## 2019-10-25 DIAGNOSIS — O24410 Gestational diabetes mellitus in pregnancy, diet controlled: Secondary | ICD-10-CM

## 2019-10-25 DIAGNOSIS — O283 Abnormal ultrasonic finding on antenatal screening of mother: Secondary | ICD-10-CM | POA: Insufficient documentation

## 2019-10-25 DIAGNOSIS — Z363 Encounter for antenatal screening for malformations: Secondary | ICD-10-CM

## 2019-10-25 IMAGING — US US MFM OB FOLLOW-UP
1 series · 14 of 28 positions shown · non-contrast
Comparison: none

[Series 1: us mfm ob follow-up · 45 acquisitions, 14 frames shown]
[im 2/45]
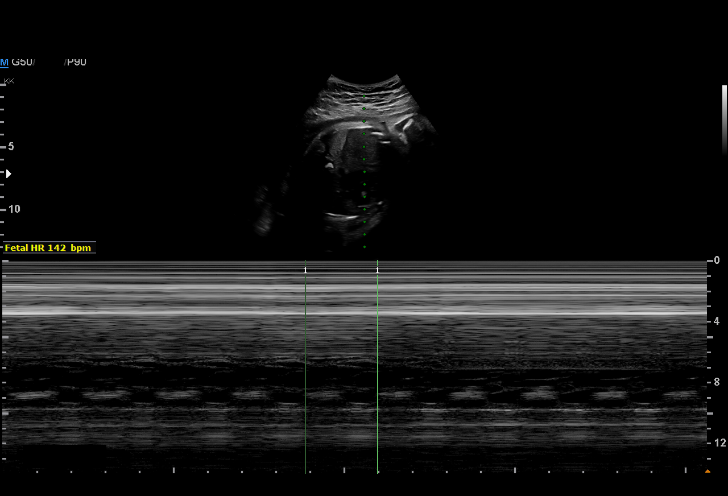
[im 5/45]
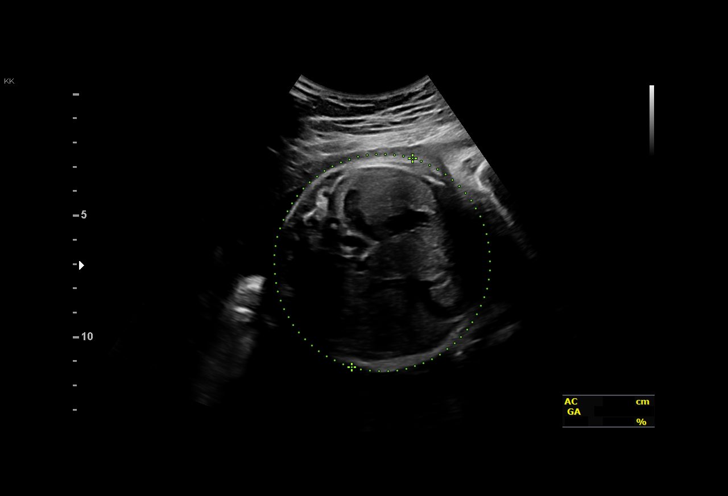
[im 9/45]
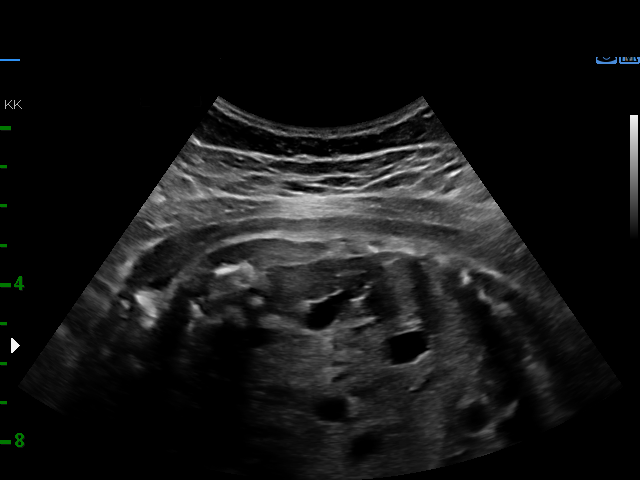
[im 12/45]
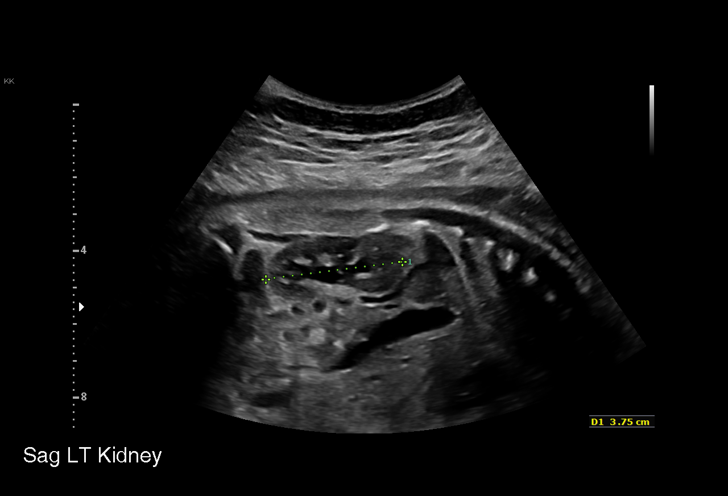
[im 15/45]
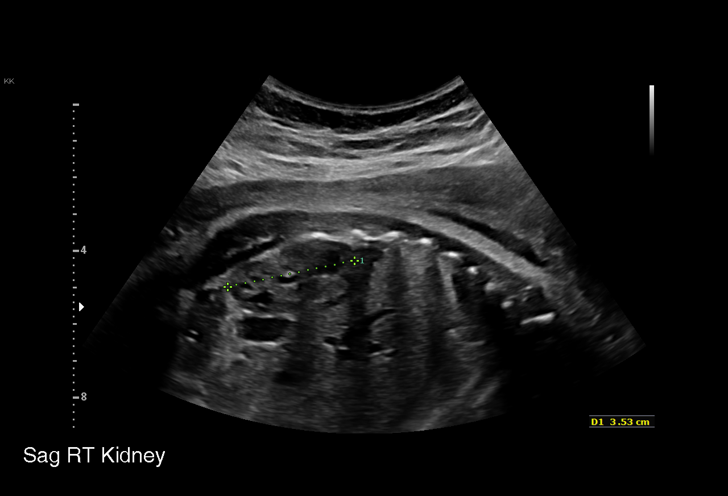
[im 18/45]
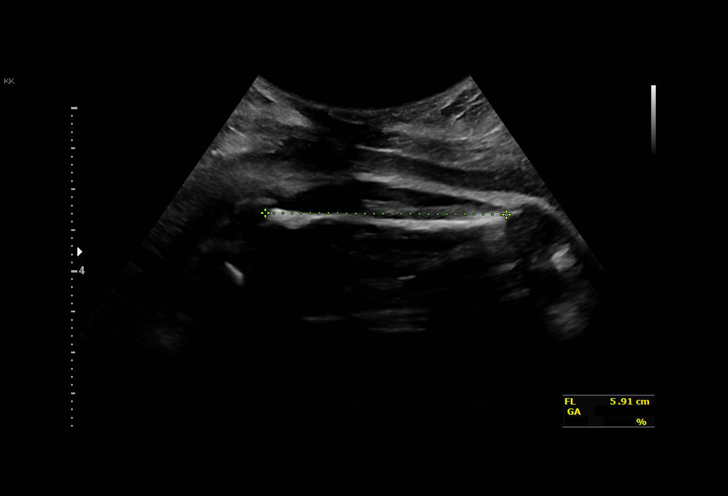
[im 22/45]
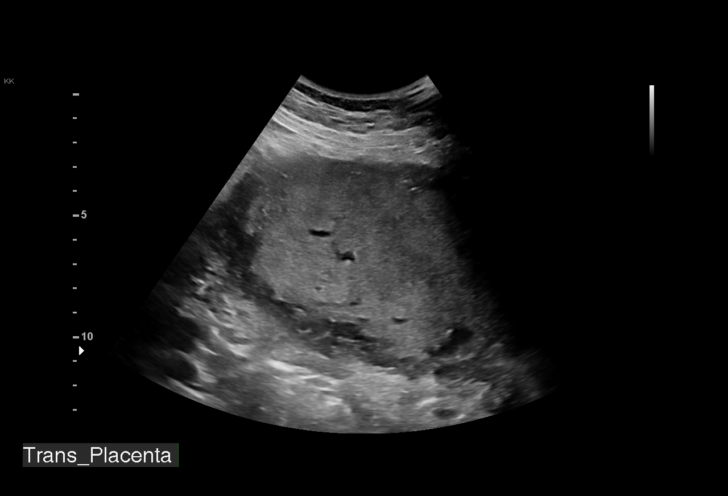
[im 25/45]
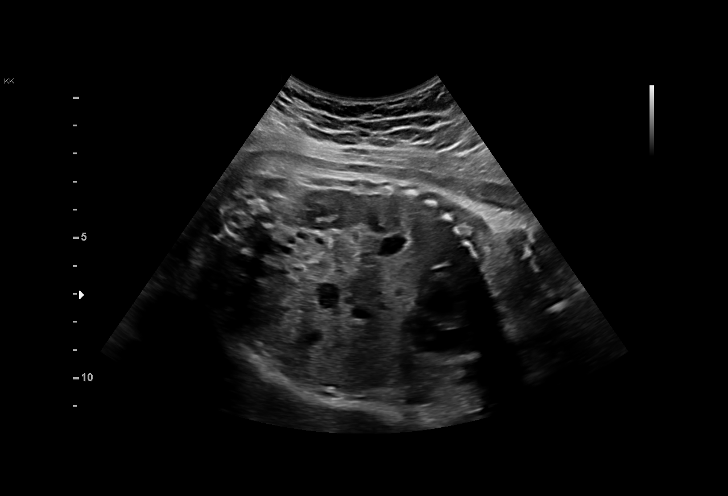
[im 28/45]
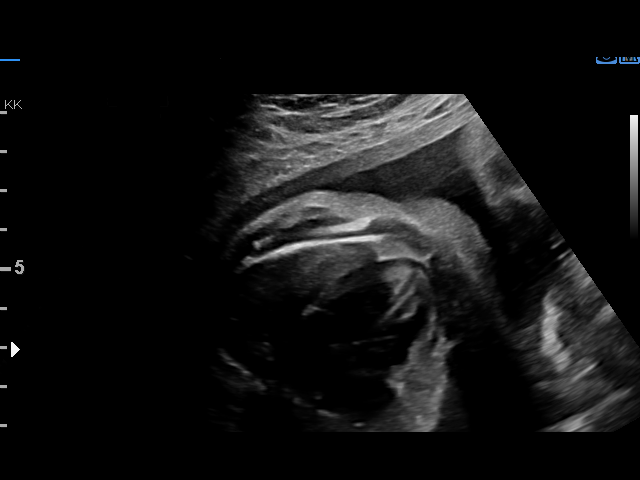
[im 31/45]
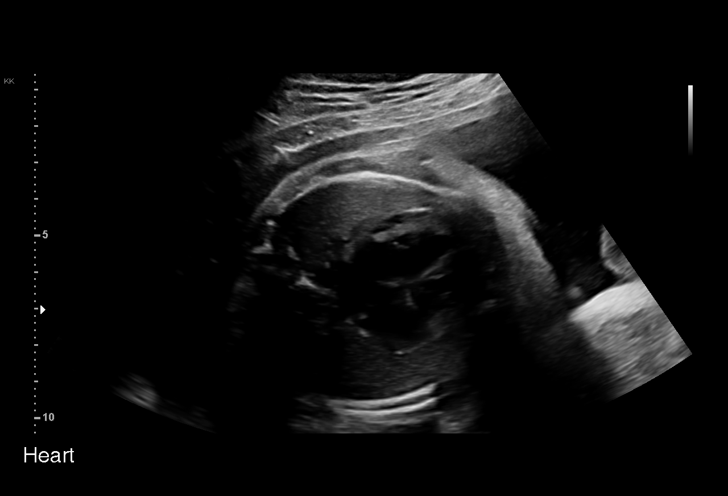
[im 35/45]
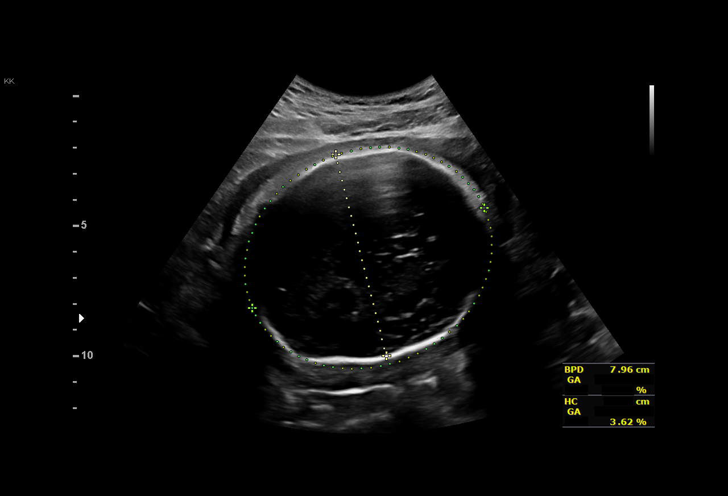
[im 38/45]
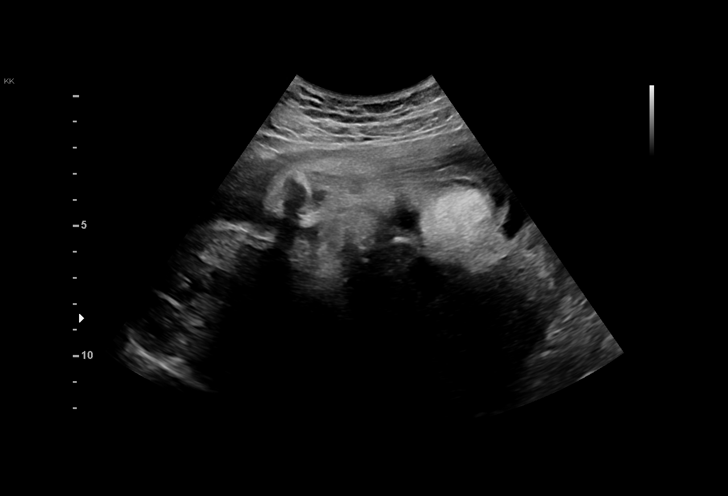
[im 41/45]
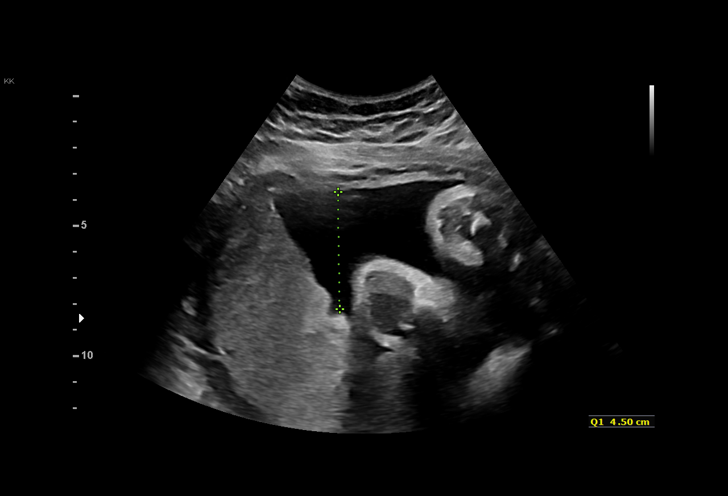
[im 45/45]
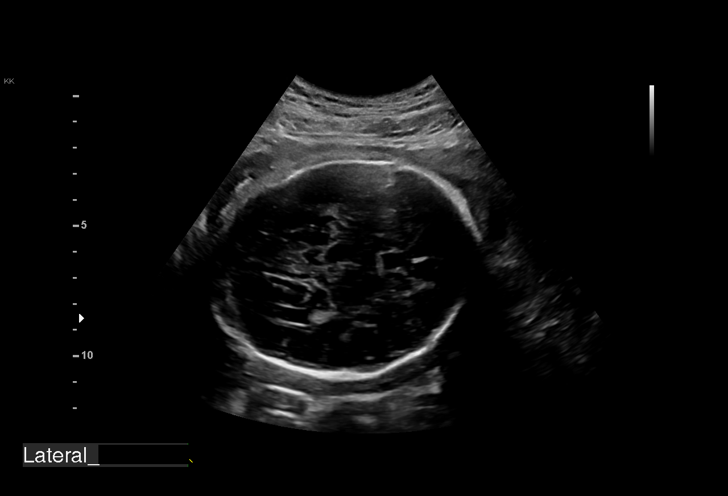

[14 of 28 positions shown; findings below may reference images not displayed]

MORALES

                   [REDACTED]-
                   Faculty Physician

Indications

 Gestational diabetes in pregnancy, diet         [P1]
 controlled
 Poor obstetric history: Previous                [P1]
 preeclampsia / eclampsia/gestational HTN
 32 weeks gestation of pregnancy
 Encounter for other antenatal screening         [P1]
 follow-up
 Pyelectasis of fetus on prenatal ultrasound     [P1]
 (resolved)
 Low Risk NIPS
Fetal Evaluation

 Num Of Fetuses:          1
 Fetal Heart Rate(bpm):   142
 Cardiac Activity:        Observed
 Presentation:            Cephalic
 Placenta:                Posterior
 P. Cord Insertion:       Previously Visualized

 Amniotic Fluid
 AFI FV:      Within normal limits

 AFI Sum(cm)     %Tile       Largest Pocket(cm)
 16.68           60          5.
 RUQ(cm)       RLQ(cm)       LUQ(cm)        LLQ(cm)
 4.5           3.38          5
Biometry

 BPD:      79.3  mm     G. Age:  31w 6d         36  %    CI:        75.32   %    70 - 86
                                                         FL/HC:       20.2  %    19.1 -
 HC:      289.8  mm     G. Age:  31w 6d         13  %    HC/AC:       1.03       0.96 -
 AC:      280.1  mm     G. Age:  32w 0d         50  %    FL/BPD:      73.8  %    71 - 87
 FL:       58.5  mm     G. Age:  30w 4d          8  %    FL/AC:       20.9  %    20 - 24

 Est. FW:    [P1]   gm    3 lb 15 oz     26  %
OB History

 Gravidity:    2         Term:   1        Prem:   0        SAB:   0
 TOP:          0       Ectopic:  0        Living: 1
Gestational Age

 LMP:           35w 5d        Date:  [DATE]                 EDD:   [DATE]
 U/S Today:     31w 4d                                        EDD:   [DATE]
 Best:          32w 0d     Det. By:  U/S  ([DATE])          EDD:   [DATE]
Anatomy

 Cranium:               Appears normal         Aortic Arch:            Previously seen
 Cavum:                 Previously seen        Ductal Arch:            Previously seen
 Ventricles:            Appears normal         Diaphragm:              Appears normal
 Choroid Plexus:        Previously seen        Stomach:                Appears normal, left
                                                                       sided
 Cerebellum:            Previously seen        Abdomen:                Appears normal
 Posterior Fossa:       Previously seen        Abdominal Wall:         Previously seen
 Nuchal Fold:           Not applicable (>20    Cord Vessels:           Previously seen
                        wks GA)
 Face:                  Orbits and profile     Kidneys:                Appear normal
                        previously seen
 Lips:                  Previously seen        Bladder:                Appears normal
 Thoracic:              Appears normal         Spine:                  Previously seen
 Heart:                 Appears normal         Upper Extremities:      Previously seen
                        (4CH, axis, and
                        situs)
 RVOT:                  Previously seen        Lower Extremities:      Previously seen
 LVOT:                  Previously seen

 Other:  Heels and Nasal bone previously visualized.
Cervix Uterus Adnexa

 Cervix
 Not visualized (advanced GA >[P1])
Impression

 Follow up growth due to [P1]
 Normal interval growth with measurements consisent with
 dates
 Good fetal movement and amniotic fluid
Recommendations

 Follow up growth scheduled in 4 weeks.

## 2019-10-27 ENCOUNTER — Other Ambulatory Visit: Payer: Self-pay | Admitting: *Deleted

## 2019-10-27 DIAGNOSIS — O24410 Gestational diabetes mellitus in pregnancy, diet controlled: Secondary | ICD-10-CM

## 2019-11-03 ENCOUNTER — Other Ambulatory Visit: Payer: Self-pay

## 2019-11-03 ENCOUNTER — Ambulatory Visit (INDEPENDENT_AMBULATORY_CARE_PROVIDER_SITE_OTHER): Payer: Self-pay | Admitting: Obstetrics and Gynecology

## 2019-11-03 VITALS — BP 107/73 | HR 93 | Wt 134.0 lb

## 2019-11-03 DIAGNOSIS — Z789 Other specified health status: Secondary | ICD-10-CM

## 2019-11-03 DIAGNOSIS — O0990 Supervision of high risk pregnancy, unspecified, unspecified trimester: Secondary | ICD-10-CM

## 2019-11-03 DIAGNOSIS — Z8759 Personal history of other complications of pregnancy, childbirth and the puerperium: Secondary | ICD-10-CM

## 2019-11-03 DIAGNOSIS — O24410 Gestational diabetes mellitus in pregnancy, diet controlled: Secondary | ICD-10-CM

## 2019-11-03 DIAGNOSIS — Z3A33 33 weeks gestation of pregnancy: Secondary | ICD-10-CM

## 2019-11-03 DIAGNOSIS — O283 Abnormal ultrasonic finding on antenatal screening of mother: Secondary | ICD-10-CM

## 2019-11-03 LAB — POCT URINALYSIS DIP (DEVICE)
Bilirubin Urine: NEGATIVE
Glucose, UA: NEGATIVE mg/dL
Hgb urine dipstick: NEGATIVE
Ketones, ur: NEGATIVE mg/dL
Nitrite: NEGATIVE
Protein, ur: 30 mg/dL — AB
Specific Gravity, Urine: 1.02 (ref 1.005–1.030)
Urobilinogen, UA: 0.2 mg/dL (ref 0.0–1.0)
pH: 7 (ref 5.0–8.0)

## 2019-11-03 NOTE — Progress Notes (Signed)
Error - pt not seen due to symptoms of sore throat and cough Offered virtual visit, pt will reschedule

## 2019-11-15 ENCOUNTER — Ambulatory Visit: Payer: Self-pay | Admitting: Registered"

## 2019-11-15 ENCOUNTER — Other Ambulatory Visit: Payer: Self-pay

## 2019-11-15 ENCOUNTER — Encounter: Payer: Self-pay | Attending: Obstetrics and Gynecology | Admitting: Registered"

## 2019-11-15 ENCOUNTER — Encounter: Payer: Self-pay | Admitting: *Deleted

## 2019-11-15 VITALS — Wt 137.5 lb

## 2019-11-15 DIAGNOSIS — O24419 Gestational diabetes mellitus in pregnancy, unspecified control: Secondary | ICD-10-CM | POA: Insufficient documentation

## 2019-11-15 DIAGNOSIS — O24410 Gestational diabetes mellitus in pregnancy, diet controlled: Secondary | ICD-10-CM

## 2019-11-15 DIAGNOSIS — Z3A00 Weeks of gestation of pregnancy not specified: Secondary | ICD-10-CM | POA: Insufficient documentation

## 2019-11-15 NOTE — Progress Notes (Signed)
Interpreter services provided by Laveda Norman from [facility].  Patient was seen on 11/15/19 for follow-up assessment and education for Gestational Diabetes. EDD 12/20/19. Patient was asked to return for follow-up visit due to patient stated fear of eating after learning she has GDM. It appears she is still restricting but patient states she feels she eats adequately and does not go hungry. Reported diet does not appear adequate and RD encouraged patient to eat more variety and aim to eat protein with each meal. Patient is gaining weight.  Patient is testing blood glucose as directed pre breakfast and 2 hours after each meal. Review of Log sheet shows:  FBS: 70-90 mg/dL and postprandial 15-726 mg/dL  Dietary recall: B: spinach, parsley water (juiced) S: a piece of bread L: 2 tortillas, egg D: 1 1/2 c rice, sometimes beans Bev: a lot of water  10/19/19 132 lb 11/15/19 137.5 lb ~5.5 lb weight gain  The following learning objectives reviewed during follow-up visit:   Blood sugar target ranges and lower does not equal better  Importance of protein during pregnancy  Plan:  . Consider adding more variety and protein to diet.   Patient instructed to monitor glucose levels: FBS: 60 - 95 mg/dl 2 hour: <203 mg/dl  Patient received the following handouts:  Blood sugar log sheet  Patient will be seen for follow-up as needed.

## 2019-11-17 ENCOUNTER — Ambulatory Visit (INDEPENDENT_AMBULATORY_CARE_PROVIDER_SITE_OTHER): Payer: Self-pay | Admitting: Obstetrics and Gynecology

## 2019-11-17 ENCOUNTER — Encounter: Payer: Self-pay | Admitting: Obstetrics and Gynecology

## 2019-11-17 ENCOUNTER — Other Ambulatory Visit: Payer: Self-pay

## 2019-11-17 VITALS — BP 103/68 | HR 93 | Wt 137.0 lb

## 2019-11-17 DIAGNOSIS — Z789 Other specified health status: Secondary | ICD-10-CM

## 2019-11-17 DIAGNOSIS — Z8759 Personal history of other complications of pregnancy, childbirth and the puerperium: Secondary | ICD-10-CM

## 2019-11-17 DIAGNOSIS — O24410 Gestational diabetes mellitus in pregnancy, diet controlled: Secondary | ICD-10-CM

## 2019-11-17 DIAGNOSIS — O0990 Supervision of high risk pregnancy, unspecified, unspecified trimester: Secondary | ICD-10-CM

## 2019-11-17 DIAGNOSIS — Z758 Other problems related to medical facilities and other health care: Secondary | ICD-10-CM

## 2019-11-17 LAB — POCT URINALYSIS DIP (DEVICE)
Bilirubin Urine: NEGATIVE
Glucose, UA: NEGATIVE mg/dL
Ketones, ur: NEGATIVE mg/dL
Leukocytes,Ua: NEGATIVE
Nitrite: NEGATIVE
Protein, ur: NEGATIVE mg/dL
Specific Gravity, Urine: 1.025 (ref 1.005–1.030)
Urobilinogen, UA: 0.2 mg/dL (ref 0.0–1.0)
pH: 7 (ref 5.0–8.0)

## 2019-11-17 NOTE — Progress Notes (Signed)
   PRENATAL VISIT NOTE  Subjective:  Andrea Vargas is a 24 y.o. G2P1001 at [redacted]w[redacted]d being seen today for ongoing prenatal care.  She is currently monitored for the following issues for this high-risk pregnancy and has Supervision of high risk pregnancy, antepartum; Gestational diabetes; Abnormal fetal ultrasound; History of gestational hypertension; and Language barrier on their problem list.  Patient reports no complaints.  Contractions: Not present. Vag. Bleeding: None.  Movement: Present. Denies leaking of fluid.   The following portions of the patient's history were reviewed and updated as appropriate: allergies, current medications, past family history, past medical history, past social history, past surgical history and problem list.   Objective:   Vitals:   11/17/19 0824  BP: 103/68  Pulse: 93  Weight: 137 lb (62.1 kg)    Fetal Status: Fetal Heart Rate (bpm): 143 Fundal Height: 33 cm Movement: Present     General:  Alert, oriented and cooperative. Patient is in no acute distress.  Skin: Skin is warm and dry. No rash noted.   Cardiovascular: Normal heart rate noted  Respiratory: Normal respiratory effort, no problems with respiration noted  Abdomen: Soft, gravid, appropriate for gestational age.  Pain/Pressure: Present     Pelvic: Cervical exam deferred        Extremities: Normal range of motion.  Edema: None  Mental Status: Normal mood and affect. Normal behavior. Normal judgment and thought content.   Assessment and Plan:  Pregnancy: G2P1001 at [redacted]w[redacted]d 1. Supervision of high risk pregnancy, antepartum Patient is doing well without complaints Cultures next visit  2. Diet controlled gestational diabetes mellitus (GDM) in third trimester CBGs reviewed and all within range. Diet controlled Follow up growth ultrasound scheduled 9/21  3. History of gestational hypertension Normotensive   4. Language barrier Spanish interpreter present  Preterm labor symptoms and  general obstetric precautions including but not limited to vaginal bleeding, contractions, leaking of fluid and fetal movement were reviewed in detail with the patient. Please refer to After Visit Summary for other counseling recommendations.   Return in about 1 week (around 11/24/2019) for in person, ROB, High risk.  Future Appointments  Date Time Provider Department Center  11/22/2019  7:30 AM [facility]  11/22/2019  7:45 AM [facility] US4 [facility]  11/24/2019  8:15 AM Warden Fillers, MD [facility]  12/01/2019  8:15 AM Warden Fillers, MD [facility]    Catalina Antigua, MD

## 2019-11-22 ENCOUNTER — Other Ambulatory Visit: Payer: Self-pay

## 2019-11-22 ENCOUNTER — Ambulatory Visit: Payer: Self-pay | Admitting: *Deleted

## 2019-11-22 ENCOUNTER — Encounter: Payer: Self-pay | Admitting: *Deleted

## 2019-11-22 ENCOUNTER — Ambulatory Visit: Payer: Self-pay | Attending: Obstetrics and Gynecology

## 2019-11-22 DIAGNOSIS — Z362 Encounter for other antenatal screening follow-up: Secondary | ICD-10-CM

## 2019-11-22 DIAGNOSIS — O283 Abnormal ultrasonic finding on antenatal screening of mother: Secondary | ICD-10-CM | POA: Insufficient documentation

## 2019-11-22 DIAGNOSIS — O0990 Supervision of high risk pregnancy, unspecified, unspecified trimester: Secondary | ICD-10-CM

## 2019-11-22 DIAGNOSIS — O09293 Supervision of pregnancy with other poor reproductive or obstetric history, third trimester: Secondary | ICD-10-CM

## 2019-11-22 DIAGNOSIS — Z3A36 36 weeks gestation of pregnancy: Secondary | ICD-10-CM

## 2019-11-22 DIAGNOSIS — O24410 Gestational diabetes mellitus in pregnancy, diet controlled: Secondary | ICD-10-CM | POA: Insufficient documentation

## 2019-11-24 ENCOUNTER — Ambulatory Visit (INDEPENDENT_AMBULATORY_CARE_PROVIDER_SITE_OTHER): Payer: Self-pay | Admitting: Obstetrics and Gynecology

## 2019-11-24 ENCOUNTER — Other Ambulatory Visit (HOSPITAL_COMMUNITY)
Admission: RE | Admit: 2019-11-24 | Discharge: 2019-11-24 | Disposition: A | Discharge status: Home / Self Care | Payer: Self-pay | Source: Ambulatory Visit | Attending: Obstetrics and Gynecology | Admitting: Obstetrics and Gynecology

## 2019-11-24 ENCOUNTER — Other Ambulatory Visit: Payer: Self-pay

## 2019-11-24 VITALS — BP 105/66 | HR 77 | Wt 139.5 lb

## 2019-11-24 DIAGNOSIS — O0990 Supervision of high risk pregnancy, unspecified, unspecified trimester: Secondary | ICD-10-CM | POA: Insufficient documentation

## 2019-11-24 DIAGNOSIS — Z789 Other specified health status: Secondary | ICD-10-CM

## 2019-11-24 DIAGNOSIS — Z3A36 36 weeks gestation of pregnancy: Secondary | ICD-10-CM | POA: Insufficient documentation

## 2019-11-24 DIAGNOSIS — O24410 Gestational diabetes mellitus in pregnancy, diet controlled: Secondary | ICD-10-CM

## 2019-11-24 DIAGNOSIS — O283 Abnormal ultrasonic finding on antenatal screening of mother: Secondary | ICD-10-CM

## 2019-11-24 DIAGNOSIS — Z8759 Personal history of other complications of pregnancy, childbirth and the puerperium: Secondary | ICD-10-CM

## 2019-11-24 LAB — POCT URINALYSIS DIP (DEVICE)
Bilirubin Urine: NEGATIVE
Glucose, UA: NEGATIVE mg/dL
Ketones, ur: NEGATIVE mg/dL
Nitrite: NEGATIVE
Protein, ur: NEGATIVE mg/dL
Specific Gravity, Urine: 1.025 (ref 1.005–1.030)
Urobilinogen, UA: 0.2 mg/dL (ref 0.0–1.0)
pH: 6.5 (ref 5.0–8.0)

## 2019-11-24 NOTE — Patient Instructions (Addendum)
Diabetes mellitus gestacional, cuidados personales Gestational Diabetes Mellitus, Self Care Las mujeres que tienen diabetes gestacional (diabetes mellitus gestacional) deben mantener su nivel de azcar en la sangre (glucosa) dentro de un rango saludable. Es posible hacerlo por medio de lo siguiente:  Alimentacin.  Actividad fsica.  Cambios en el estilo de vida.  Medicamentos o insulina, si es necesario.  [employer] mdicos y de Producer, television/film/video. Si recibe tratamiento para esta afeccin, es posible que ni usted ni su beb en gestacin (feto) se vean afectados. Si no recibe tratamiento, esta afeccin puede causar problemas que pueden ser perjudiciales para usted o su beb en gestacin. Si tiene diabetes gestacional:  Es ms probable que vuelva a tenerla si queda embarazada nuevamente.  Es ms probable que desarrolle diabetes tipo2 en el futuro. Cmo mantenerse informada sobre Retail buyer de azcar en la sangre   Controle su nivel de azcar en la sangre todos los das durante el [place]. Haga los controles con la frecuencia que le hayan indicado.  Llame al mdico si el nivel de azcar en la sangre est por encima de las cifras ideales en dosanlisis seguidos. El mdico fijar objetivos de tratamiento personales para usted. Generalmente, los [employer] niveles de Location manager en la sangre deben ser los siguientes:  Antes de las comidas, o despus de no haber comido durante un tiempo prolongado (en ayunas o preprandial): igual o menor que 95 mg/dl (5,3 mmol/l).  Despus de las comidas (posprandial): ? Una hora despus de una comida: igual o menor que 134m/dl (7,844ml/l). ? Dos horas despus de una comida: igual o menor que 12079ml (6,7mm15ml).  Nivel de A1c (hemoglobinaA1c): del 6% al 6,5%. Cmo controlar los niveles altos y bajos de azcaLocation managerla sangre Signos de un nivel alto de azcar en la sangre Un nivel alto de azcar en la sangre se denomina hiperglucemia. Conozca  cules son los signos de un nivel alto de azcaDispensing opticians signos pueden incluir lo siguiente:  Sentir: ? Sed. ? Hambre. ? Mucho cansancio.  Necesidad de orinGarment/textile technologist mayor frecuencia que lo habitual.  Visin borrosa. Signos de un nivel bajo de azcar en la sangre Un nivel bajo de azcar en la sangre se denomina hipoglucemia. Este cuadro ocurre cuando el nivel de azcar en la sangre es igual o menor que 70mg106m(3,9mmol74m. Los signos pueden incluir lo siguiente:  Sentir: ? Hambre. ? Preocupacin o nervios (ansiedad). ? Sudoracin y piel hIntel Corporationnfusin. ? Mareos. ? Somnolencia. ? Ganas de vomitar (nuseas).  Tener: ? Latidos cardacos acelerados. ? Dolor de cabezaNetherlandsmbios en la visin. ? Hormigueo y falta de sensibilidad (entumecimiento) alrededor de la boca, labios o lengua. ? Movimientos espasmdicos que no puede controlar (convulsiones).  Dificultades para hacer lo siguiente: ? Moverse (coordinacin). ? Dormir. ? Desmayos. ? Molestarse con facilidad (irritabilidad). Tratamiento del nivel bajo de azcar en la sangre Para tratar un nivel bajo de azcar en la sangre, ingiera un alimento o una bebida azucarada de inmediato. Si puede pensar con claridad y tragar de manera segura, siga la regla 15/15, que consiste en lo siguiente:  Consuma 15gramos de un hidrato de carbono de accin rpida (carbohidrato). Hable con su mdico acerca de cunto debera consumir.  Algunos hidratos de carbono de accin rpida son: ? Comprimidos de azcar (pastillas de glucosa). Consuma 3o 4pastillas de glucosa. ? De 6 a 8unidades de caramelos duros. ? De 4 a 6onzas (de 120 a 150ml) 16mugo de frutas. ?  De 4 a 6onzas (de 120 a 134m) de refresco comn (no diettico). ? 1 cucharada (163m de miel o azcar.  Contrlese el nivel de azcar en la sangre 1536mtos despus de ingerir el hidrato de carbono.  Si el nivel de azcar en la sangre todava es igual o menor que  20m8m (3,9mmo25m), ingiera nuevamente 15gramos de un hidrato de carbono.  Si el nivel de azcar en la sangre no supera los 20mg/37m3,9mmol/35mdespus de 3intentos, solicite ayuda de inmediato.  Ingiera una comida o una colacin en el transcurso de 1hora despus de que el nivel de azcar en la sangre se haya normalizado. Tratamiento del nivel muy bajo de azcar en la sangre Si el nivel de azcar en la sangre es igual o menor que 54mg/dl48mmol/l)41mignifica que est muy bajo (hipoglucemia grave). Esto es una emergEngineer, maintenance (IT)re a ver si los sntomas desaparecen. Solicite atencin mdica de inmediato. Comunquese con el servicio de emergencias de su localidad (911 en los [place]). Si su nivel de azcar en la sangre es muy bajo y no puede ingerir ningn alimento ni bebida, tal vez deba aplicarse una inyeccin de glucagn. Un familiar o un amigo deben aprender a controlarle el azcar en la sangre y a aplicarle una inyeccin de glucagn. Pregntele al mdico si debe tener un kit de inyecciones de glucagn en su casa. Siga estas indicaciones en su casa: Medicamentos  Aplquese la insulina y tome los medicamentos para la diabetes como se lo hayan indicado.  Si el mdico le indica que se aplique ms o menos insulina, o que tome ms o menos medicamentos, haga exactamente lo que le diga. LandAmerica Financialquede sin insulina o sin medicamentos. Alimentos   Opte por opciones de alimentos saludables. Estos incluyen los siguientes: ? Pollo, pescado, claras de huevos y frijoles. ? Avena, harina integral, trigo burgol, arroz integral, quinua y mijo. ? Frutas y Lambert Modyas frescas. ? Productos lcteos descremados. ? Frutos secos, aguacate, aceite de oliva y aceite de canola.  Consulte a un especialista en alimentacin (nutricionista). Este profesional puede ayudarla a elaborar Paediatric nursede alimentacin adecuado para usted.  Siga las indicaciones del mdico respecto de lo que no puede comer o  beber.  Beba suficiente lquido para mantener Contractororina) de color amarillo plido.  Ingiera refrigerios saludables entre comidas nutritivas.  Lleve un registro de los hidratos de carbono que consume. Para hacerlo, lea las etiquetas de informacin nutricional y aprenda cules son los tamaos de las porciones de los alimentos.  Siga su plan para los das de enfermedad cuando no pueda comer ni beber normalmente. Elabore eUnited Auto el mdico, de modo que est listo para usarlo. Actividad  Haga actividad fsica durante 30minutos34ms por da, o durante el tiempo que el mdico le rViacome.  Hable con el mdico antes de comenzar una rutina de ejercicio o actividad nueva. Es posible que el mdico le indique que haga cambios en: ? La cantidad de insulina qDover Corporation o los medicamentos que toma. ? Cunto debe comer. Estilo de vida  No beba alcohol.  No use productos que contengan tabaco. Estos incluyen cigarrillos, tabaco para mascar y cigarrilloPsychologist, sport and exerciseita ayuda para dejar de fumar, consulte al mdico.  Aprenda cmo sobrellevar el estrs. Si necesita ayuda para lograrlo, consulte al mdico. CuiMeadWestvacodel cuerpo  Mantngase al da con las vacunas (inmunizaciones).  Cepllese los dientes y las encas Rattanlo dental unaArdelia Mems  o ms veces por da.  Vaya al dentista una vez cada 42mses o con ms frecuencia.  Mantenga un peso sTax adviser Indicaciones generales  TDelphide venta libre y los recetados solamente como se lo haya indicado el mdico.  Pregntele al mdico sobre los riesgos de la presin arterial alta en el embarazo (preeclampsia y eclampsia).  Comparta su plan de atencin de la diabetes con: ? Sus compaeros de trabajo o de la escuela. ? LAnadarko Petroleum Corporationcon las que cMayo  Hgase pruebas de orina para dProduct managerpresencia de cetonas: ? Cuando est enferma. ? Como se lo haya indicado el  mdico.  Lleve consigo una tarjeta, o use un brazalete o una medalla que indique que tiene diabetes.  Concurra a todas las visitas de control como se lo haya indicado el mdico. Esto es importante. Cuidados despus del parto  Hgase controlar el nivel de azcar en la sangre 4 a 12semanas despus del parto.  Hgase controlar si tiene diabetes una o ms veces en el plazo de 3 aos. Preguntas para hacerle al mdico  Es necesario que me rena con uRadio broadcast assistanten el cuidado de la diabetes?  Dnde puedo encontrar un grupo de a44para mujeres con diabetes gestacional? Dnde buscar ms informacin Para obtener ms informacin sobre la diabetes, visite los siguientes sitios web:  American Diabetes Association (Asociacin Estadounidense de la Diabetes): www.diabetes.org  Centers for Disease Control and Prevention (Librarian, academic (Centros para eBuilding surveyory la Prevencin de EArboriculturist: whttp://www.wolf.info/Resumen  Controle su nivel de azcar en la sangre (glucosa) tEctor Haga los controles con la frecuencia que le hayan indicado.  Aplquese la insulina y tome los medicamentos para la diabetes como se lo hayan indicado.  Concurra a todas las visitas de control como se lo haya indicado el mdico. Esto es importante.  Hgase controlar el nivel de azcar en la sangre 4 a 12semanas despus del parto. Esta informacin no tiene cMarine scientistel consejo del mdico. Asegrese de hacerle al mdico cualquier pregunta que tenga. Document Revised: 10/01/2017 Document Reviewed: 10/01/2017 Elsevier Patient Education  2020 EElm Creektrimestre de eMedia plannerThird Trimester of Pregnancy  El tercer trimestre comprende desde la sInternational Business Machinesla [id number](desde el mes7 hasta el mes9). En este trimestre, el beb en gestacin (feto) crece muy rpidamente. Hacia el final del noveno mes, el beb en gestacin mide alrededor de 20pulgadas (45cm) de largo. Pesa entre 6y  10libras ([id number]. Siga estas indicaciones en su casa: Medicamentos  TDelphide venta libre y los recetados solamente como se lo haya indicado el mdico. Algunos medicamentos son seguros para tomar durante el eMedia plannery otros no lo son.  Tome vitaminas prenatales que contengan por lo menos [id number](?g) de cido flico.  Si tiene dificultad para mover el intestino (estreimiento), tome un medicamento para ablandar las heces (laxante) si su mdico se lo autoriza. Comida y bebida   Ingiera alimentos saludables de mSt. Peterregular.  No coma carne cruda ni quesos sin cocinar.  Si obtiene poca cantidad de calcio de los alimentos que ingiere, consulte a su mdico sobre la posibilidad de tomar un suplemento diario de calcio.  La ingesta diaria de cuatro o cinco comidas pequeas en lugar de tres comidas abundantes.  Evite el consumo de alimentos ricos en grasas y azcares, como los alimentos fritos y los dulces.  Para evitar el estreimiento: ? Consuma alimentos ricos en fibra, como frutas  y verduras frescas, cereales integrales y frijoles. ? Beba suficiente lquido para mantener el pis (orina) claro o de color amarillo plido. Actividad  Haga ejercicios solamente como se lo haya indicado el mdico. Interrumpa la actividad fsica si comienza a tener calambres.  No levante objetos pesados, use zapatos de tacones bajos y sintese derecha.  No haga ejercicio si hace demasiado calor, hay demasiada humedad o se encuentra en un lugar de mucha altura (altitud alta).  Puede continuar teniendo [employer], a menos que el mdico le indique lo contrario. Alivio del dolor y del Tree surgeon  Use un sostn que le brinde buen soporte si sus mamas estn sensibles.  Haga pausas frecuentes y descanse con las piernas levantadas si tiene calambres en las piernas o dolor en la zona lumbar.  Dese baos de asiento con agua tibia para Best boy o las molestias causadas  por las hemorroides. Use una crema para las hemorroides si el mdico la autoriza.  Si desarrolla venas hinchadas y abultadas (vrices) en las piernas: ? Use medias de compresin o medias de descanso como se lo haya indicado el mdico. ? Levante (eleve) los pies durante 60mnutos, 3 o 4veces por dTraining and development officer ? Limite el consumo de sal en sus alimentos. Seguridad  CMetLifecinturn de seguridad cuando conduzca.  Haga una lista de los nmeros de telfono de eFreight forwarder que iBJ'snmeros de telfono de familiares, amigos, eSolenhospital, as como los departamentos de polica y bomberos. Preparacin para la llegada del beb Para prepararse para la llegada de su beb:  Tome clases prenatales.  Practique ir mGuardian Life Insuranceal hospital.  VLakeland Surgical And Diagnostic Center LLP Florida Campusy recorra el rea de maternidad.  Hable en su trabajo acerca de tomar licencia cuando llegue el beb.  Prepare el bolso que llevar al hospital.  Prepare la habitacin del beb.  Concurra a los controles mdicos.  Compre un asiento de seguridad oAutoNationatrs para llevar al beb en el automvil. Aprenda cmo instalarlo en el auto. Instrucciones generales  No se d baos de inmersin en agua caliente, baos turcos ni saunas.  No consuma ningn producto que contenga nicotina o tabaco, como cigarrillos y cPsychologist, sport and exercise Si necesita ayuda para dejar de fumar, consulte al mdico.  No beba alcohol.  No se haga duchas vaginales ni use tampones o toallas higinicas perfumadas.  No mantenga las piernas cruzadas durante mucho tiempo.  No haga viajes de larga distancia, excepto si es obligatorio. Hgalos solamente si su mdico la autoriza.  Visite a su dentista si no lo ha hQuarry manager Use un cepillo de cerdas suaves para cepillarse los dientes. Psese el hilo dental con suavidad.  Evite el contacto con las bandejas sanitarias de los gatos y la tierra que estos animales usan. Estos elementos contienen bacterias  que pueden causar defectos congnitos al beb y la posible prdida del beb (aborto espontneo) o la muerte fetal.  Concurra a todas las visitas prenatales como se lo haya indicado el mdico. Esto es importante. Comunquese con un mdico si:  No est segura de si est en trabajo de parto o si ha roto la bolsa de las aguas.  Tiene mareos.  Tiene clicos leves o siente presin en la parte baja del vientre.  Sufre un dolor persistente en el abdomen.  Sigue teniendo mGuardian Life Insurance vomita o tiene heces lquidas.  Advierte un lquido con olor ftido que proviene de la vagina.  Siente dolor al oContinental Airlines Solicite ayuda de inmediato si:  Tiene fiebre.  Tiene una prdida de lquido por la vagina.  Tiene sangrado o pequeas prdidas vaginales.  Siente dolor intenso o clicos en el abdomen.  Aumenta o baja de peso rpidamente.  Tiene dificultades para recuperar el aliento y siente dolor en el pecho.  Sbitamente se le hinchan mucho el rostro, las [place], los tobillos, los pies o las piernas.  No ha sentido los movimientos del beb durante Leone Brand.  Siente un dolor de cabeza intenso que no se alivia con medicamentos.  Tiene dificultad para ver.  Tiene prdida de lquido o Chief Financial Officer chorro de lquido de la vagina antes de estar en la semana 37.  Tiene espasmos abdominales (contracciones) regulares antes de estar en la semana 12. Resumen  El tercer trimestre comprende desde la [employer] la [id number] (desde el mes7 hasta el mes9). Esta es la poca en que el beb en gestacin crece muy rpidamente.  Siga los consejos del mdico con respecto a los medicamentos, la alimentacin y [place].  Preprese para la llegada del beb tomando las clases prenatales, preparando todo lo que necesitar el beb, arreglando la habitacin del beb y concurriendo a los controles mdicos.  Solicite ayuda de inmediato si tiene sangrado por la vagina, siente dolor en el pecho o tiene  dificultad para respirar, o si no ha sentido que su beb se mueve en el transcurso de ms de [employer] Corporation. Esta informacin no tiene Marine scientist el consejo del mdico. Asegrese de hacerle al mdico cualquier pregunta que tenga. Document Revised: 09/22/2016 Document Reviewed: 09/22/2016 Elsevier Patient Education  [facility].

## 2019-11-24 NOTE — Progress Notes (Signed)
   PRENATAL VISIT NOTE  Subjective:  Andrea Vargas is a 24 y.o. G2P1001 at [redacted]w[redacted]d being seen today for ongoing prenatal care.  She is currently monitored for the following issues for this high-risk pregnancy and has Supervision of high risk pregnancy, antepartum; Diet controlled gestational diabetes mellitus (GDM) in third trimester; Abnormal fetal ultrasound; History of gestational hypertension; and Language barrier on their problem list.  Patient doing well with no acute concerns today. She reports no complaints.  Contractions: Not present. Vag. Bleeding: None.  Movement: Present. Denies leaking of fluid.   The following portions of the patient's history were reviewed and updated as appropriate: allergies, current medications, past family history, past medical history, past social history, past surgical history and problem list. Problem list updated.  Objective:   Vitals:   11/24/19 0843  BP: 105/66  Pulse: 77  Weight: 139 lb 8 oz (63.3 kg)    Fetal Status: Fetal Heart Rate (bpm): 140 Fundal Height: 36 cm Movement: Present  Presentation: Vertex  General:  Alert, oriented and cooperative. Patient is in no acute distress.  Skin: Skin is warm and dry. No rash noted.   Cardiovascular: Normal heart rate noted  Respiratory: Normal respiratory effort, no problems with respiration noted  Abdomen: Soft, gravid, appropriate for gestational age.  Pain/Pressure: Present     Pelvic: Cervical exam performed Dilation: 1 Effacement (%): 70 Station: -2  Extremities: Normal range of motion.  Edema: None  Mental Status:  Normal mood and affect. Normal behavior. Normal judgment and thought content.   Assessment and Plan:  Pregnancy: G2P1001 at [redacted]w[redacted]d  1. Language barrier interpreter  2. History of gestational hypertension Excellent blood sugar control  3. Abnormal fetal ultrasound   4. Supervision of high risk pregnancy, antepartum  - Strep Gp B NAA - GC/Chlamydia probe amp ([facility])not at [facility]  5. Diet controlled gestational diabetes mellitus (GDM) in third trimester Pt completed growth scan on 9/21, EFW at 31% 5 pounds 13 ounces  6. 36 weeks  Preterm labor symptoms and general obstetric precautions including but not limited to vaginal bleeding, contractions, leaking of fluid and fetal movement were reviewed in detail with the patient.  Please refer to After Visit Summary for other counseling recommendations.   Return in about 1 week (around 12/01/2019) for [facility], in person.   Mariel Aloe, MD

## 2019-11-25 LAB — GC/CHLAMYDIA PROBE AMP (~~LOC~~) NOT AT ARMC
Chlamydia: NEGATIVE
Comment: NEGATIVE
Comment: NORMAL
Neisseria Gonorrhea: NEGATIVE

## 2019-11-26 LAB — STREP GP B NAA: Strep Gp B NAA: POSITIVE — AB

## 2019-12-01 ENCOUNTER — Ambulatory Visit (INDEPENDENT_AMBULATORY_CARE_PROVIDER_SITE_OTHER): Payer: Self-pay | Admitting: Obstetrics and Gynecology

## 2019-12-01 ENCOUNTER — Other Ambulatory Visit: Payer: Self-pay

## 2019-12-01 VITALS — BP 114/72 | HR 76 | Wt 139.8 lb

## 2019-12-01 DIAGNOSIS — Z3A37 37 weeks gestation of pregnancy: Secondary | ICD-10-CM

## 2019-12-01 DIAGNOSIS — Z8759 Personal history of other complications of pregnancy, childbirth and the puerperium: Secondary | ICD-10-CM

## 2019-12-01 DIAGNOSIS — Z789 Other specified health status: Secondary | ICD-10-CM

## 2019-12-01 DIAGNOSIS — O0990 Supervision of high risk pregnancy, unspecified, unspecified trimester: Secondary | ICD-10-CM

## 2019-12-01 DIAGNOSIS — O24410 Gestational diabetes mellitus in pregnancy, diet controlled: Secondary | ICD-10-CM

## 2019-12-01 DIAGNOSIS — Z603 Acculturation difficulty: Secondary | ICD-10-CM

## 2019-12-01 NOTE — Patient Instructions (Signed)
Tercer trimestre de embarazo Third Trimester of Pregnancy  El tercer trimestre comprende desde la semana28 hasta la semana40 (desde el mes7 hasta el mes9). En este trimestre, el beb en gestacin (feto) crece muy rpidamente. Hacia el final del noveno mes, el beb en gestacin mide alrededor de 20pulgadas (45cm) de largo. Pesa entre 6y 10libras (2,70y 4,50kg). Siga estas indicaciones en su casa: Medicamentos  Tome los medicamentos de venta libre y los recetados solamente como se lo haya indicado el mdico. Algunos medicamentos son seguros para tomar durante el embarazo y otros no lo son.  Tome vitaminas prenatales que contengan por lo menos 600microgramos (?g) de cido flico.  Si tiene dificultad para mover el intestino (estreimiento), tome un medicamento para ablandar las heces (laxante) si su mdico se lo autoriza. Comida y bebida   Ingiera alimentos saludables de manera regular.  No coma carne cruda ni quesos sin cocinar.  Si obtiene poca cantidad de calcio de los alimentos que ingiere, consulte a su mdico sobre la posibilidad de tomar un suplemento diario de calcio.  La ingesta diaria de cuatro o cinco comidas pequeas en lugar de tres comidas abundantes.  Evite el consumo de alimentos ricos en grasas y azcares, como los alimentos fritos y los dulces.  Para evitar el estreimiento: ? Consuma alimentos ricos en fibra, como frutas y verduras frescas, cereales integrales y frijoles. ? Beba suficiente lquido para mantener el pis (orina) claro o de color amarillo plido. Actividad  Haga ejercicios solamente como se lo haya indicado el mdico. Interrumpa la actividad fsica si comienza a tener calambres.  No levante objetos pesados, use zapatos de tacones bajos y sintese derecha.  No haga ejercicio si hace demasiado calor, hay demasiada humedad o se encuentra en un lugar de mucha altura (altitud alta).  Puede continuar teniendo relaciones sexuales, a menos que el  mdico le indique lo contrario. Alivio del dolor y del malestar  Use un sostn que le brinde buen soporte si sus mamas estn sensibles.  Haga pausas frecuentes y descanse con las piernas levantadas si tiene calambres en las piernas o dolor en la zona lumbar.  Dese baos de asiento con agua tibia para aliviar el dolor o las molestias causadas por las hemorroides. Use una crema para las hemorroides si el mdico la autoriza.  Si desarrolla venas hinchadas y abultadas (vrices) en las piernas: ? Use medias de compresin o medias de descanso como se lo haya indicado el mdico. ? Levante (eleve) los pies durante 15minutos, 3 o 4veces por da. ? Limite el consumo de sal en sus alimentos. Seguridad  Colquese el cinturn de seguridad cuando conduzca.  Haga una lista de los nmeros de telfono de emergencia, que incluya los nmeros de telfono de familiares, amigos, el hospital, as como los departamentos de polica y bomberos. Preparacin para la llegada del beb Para prepararse para la llegada de su beb:  Tome clases prenatales.  Practique ir manejando al hospital.  Visite el hospital y recorra el rea de maternidad.  Hable en su trabajo acerca de tomar licencia cuando llegue el beb.  Prepare el bolso que llevar al hospital.  Prepare la habitacin del beb.  Concurra a los controles mdicos.  Compre un asiento de seguridad orientado hacia atrs para llevar al beb en el automvil. Aprenda cmo instalarlo en el auto. Instrucciones generales  No se d baos de inmersin en agua caliente, baos turcos ni saunas.  No consuma ningn producto que contenga nicotina o tabaco, como cigarrillos y cigarrillos   electrnicos. Si necesita ayuda para dejar de fumar, consulte al mdico.  No beba alcohol.  No se haga duchas vaginales ni use tampones o toallas higinicas perfumadas.  No mantenga las piernas cruzadas durante mucho tiempo.  No haga viajes de larga distancia, excepto si es  obligatorio. Hgalos solamente si su mdico la autoriza.  Visite a su dentista si no lo ha hecho durante el embarazo. Use un cepillo de cerdas suaves para cepillarse los dientes. Psese el hilo dental con suavidad.  Evite el contacto con las bandejas sanitarias de los gatos y la tierra que estos animales usan. Estos elementos contienen bacterias que pueden causar defectos congnitos al beb y la posible prdida del beb (aborto espontneo) o la muerte fetal.  Concurra a todas las visitas prenatales como se lo haya indicado el mdico. Esto es importante. Comunquese con un mdico si:  No est segura de si est en trabajo de parto o si ha roto la bolsa de las aguas.  Tiene mareos.  Tiene clicos leves o siente presin en la parte baja del vientre.  Sufre un dolor persistente en el abdomen.  Sigue teniendo malestar estomacal, vomita o tiene heces lquidas.  Advierte un lquido con olor ftido que proviene de la vagina.  Siente dolor al orinar. Solicite ayuda de inmediato si:  Tiene fiebre.  Tiene una prdida de lquido por la vagina.  Tiene sangrado o pequeas prdidas vaginales.  Siente dolor intenso o clicos en el abdomen.  Aumenta o baja de peso rpidamente.  Tiene dificultades para recuperar el aliento y siente dolor en el pecho.  Sbitamente se le hinchan mucho el rostro, las manos, los tobillos, los pies o las piernas.  No ha sentido los movimientos del beb durante una hora.  Siente un dolor de cabeza intenso que no se alivia con medicamentos.  Tiene dificultad para ver.  Tiene prdida de lquido o le sale un chorro de lquido de la vagina antes de estar en la semana 37.  Tiene espasmos abdominales (contracciones) regulares antes de estar en la semana 37. Resumen  El tercer trimestre comprende desde la semana28 hasta la semana40 (desde el mes7 hasta el mes9). Esta es la poca en que el beb en gestacin crece muy rpidamente.  Siga los consejos del mdico  con respecto a los medicamentos, la alimentacin y la actividad.  Preprese para la llegada del beb tomando las clases prenatales, preparando todo lo que necesitar el beb, arreglando la habitacin del beb y concurriendo a los controles mdicos.  Solicite ayuda de inmediato si tiene sangrado por la vagina, siente dolor en el pecho o tiene dificultad para respirar, o si no ha sentido que su beb se mueve en el transcurso de ms de una hora. Esta informacin no tiene como fin reemplazar el consejo del mdico. Asegrese de hacerle al mdico cualquier pregunta que tenga. Document Revised: 09/22/2016 Document Reviewed: 09/22/2016 Elsevier Patient Education  2020 Elsevier Inc.  

## 2019-12-01 NOTE — Progress Notes (Signed)
   PRENATAL VISIT NOTE  Subjective:  Andrea Vargas is a 24 y.o. G2P1001 at [redacted]w[redacted]d being seen today for ongoing prenatal care.  She is currently monitored for the following issues for this high-risk pregnancy and has Supervision of high risk pregnancy, antepartum; Diet controlled gestational diabetes mellitus (GDM) in third trimester; Abnormal fetal ultrasound; History of gestational hypertension; Language barrier; [redacted] weeks gestation of pregnancy; and [redacted] weeks gestation of pregnancy on their problem list.  Patient doing well with no acute concerns today. She reports no complaints.  Contractions: Not present. Vag. Bleeding: None.  Movement: Present. Denies leaking of fluid.   FBS:75-94 PPBS: 85-115  The following portions of the patient's history were reviewed and updated as appropriate: allergies, current medications, past family history, past medical history, past social history, past surgical history and problem list. Problem list updated.  Objective:   Vitals:   12/01/19 0825  BP: 114/72  Pulse: 76  Weight: 139 lb 12.8 oz (63.4 kg)    Fetal Status: Fetal Heart Rate (bpm): 146   Movement: Present     General:  Alert, oriented and cooperative. Patient is in no acute distress.  Skin: Skin is warm and dry. No rash noted.   Cardiovascular: Normal heart rate noted  Respiratory: Normal respiratory effort, no problems with respiration noted  Abdomen: Soft, gravid, appropriate for gestational age.  Pain/Pressure: Present     Pelvic: Cervical exam deferred        Extremities: Normal range of motion.  Edema: None  Mental Status:  Normal mood and affect. Normal behavior. Normal judgment and thought content.   Assessment and Plan:  Pregnancy: G2P1001 at [redacted]w[redacted]d  1. Language barrier Interpreter present  2. History of gestational hypertension Blood pressure normal  3. Supervision of high risk pregnancy, antepartum Will schedule IOL  4. Diet controlled gestational diabetes mellitus  (GDM) in third trimester Excellent blood sugar control, schedule IOL at 40 weeks per antenatal guidelines  5. [redacted] weeks gestation of pregnancy   Term labor symptoms and general obstetric precautions including but not limited to vaginal bleeding, contractions, leaking of fluid and fetal movement were reviewed in detail with the patient.  Please refer to After Visit Summary for other counseling recommendations.   Return in about 1 week (around 12/08/2019) for [facility], in person.   Mariel Aloe, MD

## 2019-12-01 NOTE — Addendum Note (Signed)
Addended by: Warden Fillers on: 12/01/2019 12:41 PM   Modules accepted: Orders, SmartSet

## 2019-12-06 ENCOUNTER — Encounter: Payer: Self-pay | Admitting: Lactation Services

## 2019-12-06 DIAGNOSIS — O99820 Streptococcus B carrier state complicating pregnancy: Secondary | ICD-10-CM

## 2019-12-06 HISTORY — DX: Streptococcus B carrier state complicating pregnancy: O99.820

## 2019-12-08 ENCOUNTER — Other Ambulatory Visit: Payer: Self-pay

## 2019-12-08 ENCOUNTER — Encounter: Payer: Self-pay | Admitting: Obstetrics and Gynecology

## 2019-12-08 ENCOUNTER — Ambulatory Visit (INDEPENDENT_AMBULATORY_CARE_PROVIDER_SITE_OTHER): Payer: Self-pay | Admitting: Obstetrics and Gynecology

## 2019-12-08 VITALS — BP 114/80 | HR 88

## 2019-12-08 DIAGNOSIS — Z789 Other specified health status: Secondary | ICD-10-CM

## 2019-12-08 DIAGNOSIS — O24410 Gestational diabetes mellitus in pregnancy, diet controlled: Secondary | ICD-10-CM

## 2019-12-08 DIAGNOSIS — Z758 Other problems related to medical facilities and other health care: Secondary | ICD-10-CM

## 2019-12-08 DIAGNOSIS — O0990 Supervision of high risk pregnancy, unspecified, unspecified trimester: Secondary | ICD-10-CM

## 2019-12-08 DIAGNOSIS — O99820 Streptococcus B carrier state complicating pregnancy: Secondary | ICD-10-CM

## 2019-12-08 NOTE — Progress Notes (Signed)
Spanish Interpreter Eda R. 

## 2019-12-08 NOTE — Patient Instructions (Signed)
Parto vaginal  Vaginal Delivery    Parto vaginal significa que usted da a Kamelia empujando al bebé fuera del canal del parto (vagina). Un equipo de proveedores de atención médica la ayudará antes, durante y después del parto vaginal. Las experiencias de los nacimientos son únicas para todas las mujeres, y cada embarazo y las experiencias de nacimiento varían según dónde elija dar a Kaelee.  ¿Qué ocurrirá cuando llegue al centro de parto o al hospital?  Una vez que se inicie el trabajo de parto y haya sido admitida en el hospital o centro de parto, el médico podrá hacer lo siguiente:  · Revisar sus antecedentes de embarazo y cualquier inquietud que usted pueda tener.  · Colocarle una vía intravenosa en una de las venas. Esto se podrá usar para administrarle líquidos y medicamentos.  · Verificar su presión arterial, pulso, temperatura y frecuencia cardíaca (signos vitales).  · Verificar si la bolsa de agua (saco amniótico) se ha roto (ruptura).  · Hablar con usted sobre su plan de nacimiento y analizar las opciones para controlar el dolor.  Monitoreo  Su médico puede monitorear las contracciones (monitoreo uterino) y la frecuencia cardíaca del bebé (monitoreo fetal). Es posible que el monitoreo se necesite realizar:  · Con frecuencia, pero no continuamente (intermitentemente).  · Todo el tiempo o durante largos períodos a la vez (continuamente). El monitoreo continuo puede ser necesario si:  ? Está recibiendo determinados medicamentos, tales como medicamentos para aliviar el dolor o para hacer que las contracciones sean más fuertes.  ? Tiene complicaciones durante el embarazo o el trabajo de parto.  El monitoreo se puede realizar:  · Al colocar un estetoscopio especial o un dispositivo manual de monitoreo en el abdomen o verificar los latidos cardíacos del bebé y comprobar las contracciones.  · Al colocar monitores en el abdomen (monitores externos) para registrar los latidos cardíacos del bebé y la frecuencia y duración de  las contracciones.  · Al colocar monitores dentro del útero a través de la vagina (monitores internos) para registrar los latidos cardíacos del bebé y la frecuencia, duración y fuerza de sus contracciones. Según el tipo de monitor, puede permanecer en el útero o en la cabeza del bebé hasta el nacimiento.  · Telemetría. Se trata de un tipo de monitoreo continuo que se puede realizar con monitores externos o internos. En lugar de tener que permanecer en la cama, usted puede moverse durante la telemetría.  Examen físico  Su médico puede realizar exámenes físicos frecuentes. Esto puede incluir lo siguiente:  · Verificar cómo y dónde el bebé está ubicado en el útero.  · Verificar el cuello uterino para determinar:  ? Si se está afinando o estirando (borrando).  ? Si se está abriendo (dilatando).  ¿Qué sucede durante el trabajo de parto y el parto?    El trabajo de parto y el parto normales se dividen en tres etapas:  Etapa 1  · Esta es la etapa más larga del trabajo de parto.  · Esta etapa puede durar horas o días.  · Durante esta etapa, sentirá contracciones. En general, las contracciones son leves, infrecuentes e irregulares al principio. Se hacen más fuertes, más frecuentes (aproximadamente cada 2 o 3 minutos) y más regulares a medida que avanza en esta etapa.  · Esta etapa finaliza cuando el cuello uterino está completamente dilatado hasta 4 pulgadas (10 cm) y completamente borrado.  Etapa 2  · Esta etapa comienza una vez que el cuello uterino está totalmente borrado y dilatado,   vagina.  Puede sentir un dolor urente y por estiramiento, especialmente cuando la parte ms ancha de la cabeza del beb pasa a travs de la abertura vaginal (coronacin).  Una vez que el beb nace, el cordn umbilical se pinzar y se cortar. Esto ocurre por lo general despus  de un perodo de 1 a 2 minutos despus del parto.  Colocarn al beb sobre su pecho desnudo (contacto piel con piel) en una posicin erguida y [place] con [place] Pita abrigada. Observe al beb para detectar seales de hambre, como el reflejo de bsqueda o succin, y acrquelo al pecho para su primera alimentacin. Etapa 3  Esta etapa comienza inmediatamente despus del nacimiento del beb y finaliza despus de la expulsin de la placenta.  Esta etapa puede durar de 5 a 30 minutos.  Despus del nacimiento del beb, puede sentir contracciones cuando el cuerpo expulsa la placenta y el tero se contrae para Radio broadcast assistant. Qu puedo esperar despus del Aleen Campi de parto y [place]?  Una vez que termine el trabajo de [place], se los controlar a usted y al beb atentamente para Warehouse manager la seguridad de que ambos estn sanos y listos para ir a Higher education careers adviser. Su equipo de atencin Art gallery manager cmo cuidarse y cuidar a su beb.  Usted y el beb permanecern en la misma habitacin (cohabitacin) durante su estada en el hospital. Esto estimular una vinculacin temprana y [place].  Puede seguir recibiendo lquidos o medicamentos por va intravenosa.  Se le controlar y Engineer, maintenance (IT) el tero con regularidad (masaje fndico).  Tendr algo de inflamacin y dolor en el abdomen, la vagina y la zona de la piel entre la abertura vaginal y el ano (perineo).  Si se le realiz una incisin cerca de la vagina (episiotoma) o si ha tenido Airline pilot parto, podran indicarle que se coloque compresas fras sobre la episiotoma o Art therapist. Esto ayuda a Engineer, materials y la hinchazn.  Es posible que le den una botella rociadora para que use cuando vaya al bao para higienizarse. Siga los pasos a continuacin para usar la botella rociadora: ? Antes de orinar, llene la botella rociadora con agua tibia. No use agua caliente. ? Despus de Geographical information systems officer, [place] an est sentada en el inodoro,  use la botella rociadora para enjuagar el rea alrededor de la uretra y la abertura vaginal. Con esto podr limpiar cualquier rastro de orina y [place]. ? Llene la botella rociadora con agua limpia cada vez que vaya al bao.  Es normal tener hemorragia vaginal despus del [place]. Use un apsito sanitario para el sangrado vaginal y secrecin. Resumen  Parto vaginal significa que usted dar a Lilliann empujando al beb fuera del canal del parto (vagina).  Su mdico puede monitorear las contracciones (monitoreo uterino) y la frecuencia cardaca del beb (monitoreo fetal).  Su mdico puede realizarle un examen fsico.  El trabajo de parto y el parto normales se dividen en tres etapas.  Una vez que termina el [place] de [place], se los controlar a usted y al beb atentamente hasta que estn listos para ir a casa. Esta informacin no tiene Theme park manager el consejo del mdico. Asegrese de hacerle al mdico cualquier pregunta que tenga. Document Revised: 04/29/2017 Document Reviewed: 04/29/2017 Elsevier Patient Education  2020 [employer]. Systems analyst trimestre de Psychiatrist Third Trimester of Pregnancy  El tercer trimestre comprende desde la [employer] la semana40 (desde el mes7 hasta el mes9). En este trimestre, el beb en gestacin (  feto) crece muy rpidamente. Hacia el final del noveno mes, el beb en gestacin mide alrededor de 20pulgadas (45cm) de largo. Pesa entre 6y 10libras [phone]). Siga estas indicaciones en su casa: Medicamentos  [employer] de venta libre y los recetados solamente como se lo haya indicado el mdico. Algunos medicamentos son seguros para tomar durante el Psychiatrist y otros no lo son.  Tome vitaminas prenatales que contengan por lo menos (?g) de cido flico.  Si tiene dificultad para mover el intestino (estreimiento), tome un medicamento para ablandar las heces (laxante) si su mdico se lo autoriza. Comida y bebida   Ingiera  alimentos saludables de [place] regular.  No coma carne cruda ni quesos sin cocinar.  Si obtiene poca cantidad de calcio de los alimentos que ingiere, consulte a su mdico sobre la posibilidad de tomar un suplemento diario de calcio.  La ingesta diaria de cuatro o cinco comidas pequeas en lugar de tres comidas abundantes.  Evite el consumo de alimentos ricos en grasas y azcares, como los alimentos fritos y los dulces.  Para evitar el estreimiento: ? Consuma alimentos ricos en fibra, como frutas y verduras frescas, cereales integrales y frijoles. ? Beba suficiente lquido para mantener el pis (orina) claro o de color amarillo plido. Actividad  Haga ejercicios solamente como se lo haya indicado el mdico. Interrumpa la actividad fsica si comienza a tener calambres.  No levante objetos pesados, use zapatos de tacones bajos y sintese derecha.  No haga ejercicio si hace demasiado calor, hay demasiada humedad o se encuentra en un lugar de mucha altura (altitud alta).  Puede continuar teniendo [employer], a menos que el mdico le indique lo contrario. Alivio del dolor y del Dentist  Use un sostn que le brinde buen soporte si sus mamas estn sensibles.  Haga pausas frecuentes y descanse con las piernas levantadas si tiene calambres en las piernas o dolor en la zona lumbar.  Dese baos de asiento con agua tibia para Engineer, materials o las molestias causadas por las hemorroides. Use una crema para las hemorroides si el mdico la autoriza.  Si desarrolla venas hinchadas y abultadas (vrices) en las piernas: ? Use medias de compresin o medias de descanso como se lo haya indicado el mdico. ? Levante (eleve) los pies durante , 3 o 4veces por Futures trader. ? Limite el consumo de sal en sus alimentos. Seguridad  [employer] cinturn de seguridad cuando conduzca.  Haga una lista de los nmeros de telfono de Associate Professor, que [employer] nmeros de telfono de familiares,  amigos, [facility], as como los departamentos de polica y bomberos. Preparacin para la llegada del beb Para prepararse para la llegada de su beb:  Tome clases prenatales.  Practique ir [employer] al hospital.  [facility] y recorra el rea de maternidad.  Hable en su trabajo acerca de tomar licencia cuando llegue el beb.  Prepare el bolso que llevar al hospital.  Prepare la habitacin del beb.  Concurra a los controles mdicos.  Compre un asiento de seguridad [employer] atrs para llevar al beb en el automvil. Aprenda cmo instalarlo en el auto. Instrucciones generales  No se d baos de inmersin en agua caliente, baos turcos ni saunas.  No consuma ningn producto que contenga nicotina o tabaco, como cigarrillos y Administrator, Civil Service. Si necesita ayuda para dejar de fumar, consulte al mdico.  No beba alcohol.  No se haga duchas vaginales ni use tampones o toallas higinicas perfumadas.  No mantenga  las piernas cruzadas [employer].  No haga viajes de larga distancia, excepto si es obligatorio. Hgalos solamente si su mdico la autoriza.  Visite a su dentista si no lo ha Occupational hygienist. Use un cepillo de cerdas suaves para cepillarse los dientes. Psese el hilo dental con suavidad.  Evite el contacto con las bandejas sanitarias de los gatos y la tierra que estos animales usan. Estos elementos contienen bacterias que pueden causar defectos congnitos al beb y la posible prdida del beb (aborto espontneo) o la muerte fetal.  Concurra a todas las visitas prenatales como se lo haya indicado el mdico. Esto es importante. Comunquese con un mdico si:  No est segura de si est en trabajo de parto o si ha roto la bolsa de las aguas.  Tiene mareos.  Tiene clicos leves o siente presin en la parte baja del vientre.  Sufre un dolor persistente en el abdomen.  Sigue teniendo [employer], vomita o tiene heces  lquidas.  Advierte un lquido con olor ftido que proviene de la vagina.  Siente dolor al [employer]. Solicite ayuda de inmediato si:  Tiene fiebre.  Tiene una prdida de lquido por la vagina.  Tiene sangrado o pequeas prdidas vaginales.  Siente dolor intenso o clicos en el abdomen.  Aumenta o baja de peso rpidamente.  Tiene dificultades para recuperar el aliento y siente dolor en el pecho.  Sbitamente se le hinchan mucho el rostro, las [place], los tobillos, los pies o las piernas.  No ha sentido los movimientos del beb durante Georgianne Fick.  Siente un dolor de cabeza intenso que no se alivia con medicamentos.  Tiene dificultad para ver.  Tiene prdida de lquido o Special educational needs teacher chorro de lquido de la vagina antes de estar en la semana 37.  Tiene espasmos abdominales (contracciones) regulares antes de estar en la semana 37. Resumen  El tercer trimestre comprende desde la [employer] la semana40 (desde el mes7 hasta el mes9). Esta es la poca en que el beb en gestacin crece muy rpidamente.  Siga los consejos del mdico con respecto a los medicamentos, la alimentacin y [place].  Preprese para la llegada del beb tomando las clases prenatales, preparando todo lo que necesitar el beb, arreglando la habitacin del beb y concurriendo a los controles mdicos.  Solicite ayuda de inmediato si tiene sangrado por la vagina, siente dolor en el pecho o tiene dificultad para respirar, o si no ha sentido que su beb se mueve en el transcurso de ms de [employer]. Esta informacin no tiene Theme park manager el consejo del mdico. Asegrese de hacerle al mdico cualquier pregunta que tenga. Document Revised: 09/22/2016 Document Reviewed: 09/22/2016 Elsevier Patient Education  2020 [employer].

## 2019-12-08 NOTE — Progress Notes (Signed)
Subjective:  Andrea Vargas is a 24 y.o. G2P1001 at 103w2d being seen today for ongoing prenatal care.  She is currently monitored for the following issues for this high-risk pregnancy and has Supervision of high risk pregnancy, antepartum; Diet controlled gestational diabetes mellitus (GDM) in third trimester; Abnormal fetal ultrasound; History of gestational hypertension; Language barrier; and GBS (group B Streptococcus carrier), +RV culture, currently pregnant on their problem list.  Patient reports general discomforts of pregnancy.  Contractions: Not present. Vag. Bleeding: None.  Movement: Present. Denies leaking of fluid.   The following portions of the patient's history were reviewed and updated as appropriate: allergies, current medications, past family history, past medical history, past social history, past surgical history and problem list. Problem list updated.  Objective:   Vitals:   12/08/19 1557  BP: 114/80  Pulse: 88    Fetal Status: Fetal Heart Rate (bpm): 182   Movement: Present     General:  Alert, oriented and cooperative. Patient is in no acute distress.  Skin: Skin is warm and dry. No rash noted.   Cardiovascular: Normal heart rate noted  Respiratory: Normal respiratory effort, no problems with respiration noted  Abdomen: Soft, gravid, appropriate for gestational age. Pain/Pressure: Present     Pelvic:  Cervical exam deferred        Extremities: Normal range of motion.  Edema: None  Mental Status: Normal mood and affect. Normal behavior. Normal judgment and thought content.   Urinalysis:      Assessment and Plan:  Pregnancy: G2P1001 at [redacted]w[redacted]d  1. Supervision of high risk pregnancy, antepartum Labor precautions  2. Diet controlled gestational diabetes mellitus (GDM) in third trimester CBG's in goal range IOL at 40 weeks  3. GBS (group B Streptococcus carrier), +RV culture, currently pregnant Tx while   4. Language barrier Live interrupter used during  today's visit  Term labor symptoms and general obstetric precautions including but not limited to vaginal bleeding, contractions, leaking of fluid and fetal movement were reviewed in detail with the patient. Please refer to After Visit Summary for other counseling recommendations.  Return in about 1 week (around 12/15/2019) for OB visit, face to face, MD only.   Hermina Staggers, MD

## 2019-12-11 ENCOUNTER — Other Ambulatory Visit: Payer: Self-pay | Admitting: Family Medicine

## 2019-12-13 ENCOUNTER — Telehealth (HOSPITAL_COMMUNITY): Payer: Self-pay | Admitting: *Deleted

## 2019-12-13 NOTE — Telephone Encounter (Signed)
Interpreter number [phone] Preadmission screen

## 2019-12-14 ENCOUNTER — Telehealth (HOSPITAL_COMMUNITY): Payer: Self-pay | Admitting: *Deleted

## 2019-12-14 NOTE — Telephone Encounter (Signed)
[phone] interpreter number  Preadmission screen

## 2019-12-15 ENCOUNTER — Other Ambulatory Visit: Payer: Self-pay | Admitting: Obstetrics and Gynecology

## 2019-12-15 ENCOUNTER — Ambulatory Visit (INDEPENDENT_AMBULATORY_CARE_PROVIDER_SITE_OTHER): Payer: Self-pay | Admitting: Obstetrics and Gynecology

## 2019-12-15 ENCOUNTER — Other Ambulatory Visit: Payer: Self-pay

## 2019-12-15 VITALS — BP 106/62 | HR 90 | Wt 141.5 lb

## 2019-12-15 DIAGNOSIS — O0990 Supervision of high risk pregnancy, unspecified, unspecified trimester: Secondary | ICD-10-CM

## 2019-12-15 DIAGNOSIS — O24410 Gestational diabetes mellitus in pregnancy, diet controlled: Secondary | ICD-10-CM

## 2019-12-15 DIAGNOSIS — O358XX0 Maternal care for other (suspected) fetal abnormality and damage, not applicable or unspecified: Secondary | ICD-10-CM

## 2019-12-15 DIAGNOSIS — L299 Pruritus, unspecified: Secondary | ICD-10-CM

## 2019-12-15 DIAGNOSIS — O35EXX0 Maternal care for other (suspected) fetal abnormality and damage, fetal genitourinary anomalies, not applicable or unspecified: Secondary | ICD-10-CM

## 2019-12-15 DIAGNOSIS — Z603 Acculturation difficulty: Secondary | ICD-10-CM

## 2019-12-15 DIAGNOSIS — Z3A39 39 weeks gestation of pregnancy: Secondary | ICD-10-CM

## 2019-12-15 DIAGNOSIS — Z789 Other specified health status: Secondary | ICD-10-CM

## 2019-12-15 NOTE — Progress Notes (Signed)
Prenatal Visit Note Date: 12/15/2019 Clinic: [facility]  Subjective:  Andrea Vargas is a 24 y.o. G2P1001 at [redacted]w[redacted]d being seen today for ongoing prenatal care.  She is currently monitored for the following issues for this high-risk pregnancy and has Supervision of high risk pregnancy, antepartum; Diet controlled gestational diabetes mellitus (GDM) in third trimester; Abnormal fetal ultrasound; History of gestational hypertension; Language barrier; and GBS (group B Streptococcus carrier), +RV culture, currently pregnant on their problem list.  Patient reports arm itching.   Contractions: Irregular. Vag. Bleeding: None.  Movement: Present. Denies leaking of fluid.   The following portions of the patient's history were reviewed and updated as appropriate: allergies, current medications, past family history, past medical history, past social history, past surgical history and problem list. Problem list updated.  Objective:   Vitals:   12/15/19 1610  BP: 106/62  Pulse: 90  Weight: 141 lb 8 oz (64.2 kg)    Fetal Status: Fetal Heart Rate (bpm): 136 Fundal Height: 39 cm Movement: Present  Presentation: Vertex  General:  Alert, oriented and cooperative. Patient is in no acute distress.  Skin: Skin is warm and dry. No rash noted.   Cardiovascular: Normal heart rate noted  Respiratory: Normal respiratory effort, no problems with respiration noted  Abdomen: Soft, gravid, appropriate for gestational age. Pain/Pressure: Present     Pelvic:  Cervical exam deferred        Extremities: Normal range of motion.  Edema: None  Mental Status: Normal mood and affect. Normal behavior. Normal judgment and thought content.   Urinalysis:      Assessment and Plan:  Pregnancy: G2P1001 at [redacted]w[redacted]d  1. Supervision of high risk pregnancy, antepartum Routine. LNG IUD. Has 10/19 IOL already set up  2. Diet controlled gestational diabetes mellitus (GDM) in third trimester Normal CBG log.  Normal 9/21 growth u/s   3. Language barrier interpreter  4. Pruritus On arms with some excoriations. None on palms, soles. Rare on belly. Recommend otc creams and will check labs - Comprehensive metabolic panel - Bile acids, total  5. Fetal renal pyelectasis  Left 37mm. Tell peds at delivery  Term labor symptoms and general obstetric precautions including but not limited to vaginal bleeding, contractions, leaking of fluid and fetal movement were reviewed in detail with the patient. Please refer to After Visit Summary for other counseling recommendations.  Return if symptoms worsen or fail to improve.   [place] Bing, MD

## 2019-12-15 NOTE — Progress Notes (Signed)
Rash noted on left arm, patient states is drying up and healing.  Eathan Groman,RN

## 2019-12-16 ENCOUNTER — Inpatient Hospital Stay (HOSPITAL_COMMUNITY)
Admission: AD | Admit: 2019-12-16 | Discharge: 2019-12-18 | DRG: 807 | Disposition: A | Discharge status: Home / Self Care | Payer: Medicaid Other | Attending: Obstetrics & Gynecology | Admitting: Obstetrics & Gynecology

## 2019-12-16 ENCOUNTER — Other Ambulatory Visit: Payer: Self-pay

## 2019-12-16 ENCOUNTER — Encounter (HOSPITAL_COMMUNITY): Payer: Self-pay | Admitting: Obstetrics and Gynecology

## 2019-12-16 ENCOUNTER — Telehealth: Payer: Self-pay | Admitting: Obstetrics and Gynecology

## 2019-12-16 DIAGNOSIS — Z8759 Personal history of other complications of pregnancy, childbirth and the puerperium: Secondary | ICD-10-CM

## 2019-12-16 DIAGNOSIS — Z20822 Contact with and (suspected) exposure to covid-19: Secondary | ICD-10-CM | POA: Diagnosis present

## 2019-12-16 DIAGNOSIS — O24420 Gestational diabetes mellitus in childbirth, diet controlled: Secondary | ICD-10-CM | POA: Diagnosis present

## 2019-12-16 DIAGNOSIS — O24410 Gestational diabetes mellitus in pregnancy, diet controlled: Secondary | ICD-10-CM

## 2019-12-16 DIAGNOSIS — O99824 Streptococcus B carrier state complicating childbirth: Secondary | ICD-10-CM | POA: Diagnosis present

## 2019-12-16 DIAGNOSIS — O358XX0 Maternal care for other (suspected) fetal abnormality and damage, not applicable or unspecified: Secondary | ICD-10-CM | POA: Diagnosis present

## 2019-12-16 DIAGNOSIS — Z3A39 39 weeks gestation of pregnancy: Secondary | ICD-10-CM

## 2019-12-16 DIAGNOSIS — O26893 Other specified pregnancy related conditions, third trimester: Secondary | ICD-10-CM | POA: Diagnosis present

## 2019-12-16 DIAGNOSIS — Z3493 Encounter for supervision of normal pregnancy, unspecified, third trimester: Secondary | ICD-10-CM

## 2019-12-16 LAB — CBC
HCT: 35.9 % — ABNORMAL LOW (ref 36.0–46.0)
Hemoglobin: 11.6 g/dL — ABNORMAL LOW (ref 12.0–15.0)
MCH: 28 pg (ref 26.0–34.0)
MCHC: 32.3 g/dL (ref 30.0–36.0)
MCV: 86.7 fL (ref 80.0–100.0)
Platelets: 248 10*3/uL (ref 150–400)
RBC: 4.14 MIL/uL (ref 3.87–5.11)
RDW: 14.5 % (ref 11.5–15.5)
WBC: 10.4 10*3/uL (ref 4.0–10.5)
nRBC: 0 % (ref 0.0–0.2)

## 2019-12-16 LAB — COMPREHENSIVE METABOLIC PANEL
ALT: 12 IU/L (ref 0–32)
AST: 18 IU/L (ref 0–40)
Albumin/Globulin Ratio: 1.5 (ref 1.2–2.2)
Albumin: 3.8 g/dL — ABNORMAL LOW (ref 3.9–5.0)
Alkaline Phosphatase: 267 IU/L — ABNORMAL HIGH (ref 44–121)
BUN/Creatinine Ratio: 12 (ref 9–23)
BUN: 10 mg/dL (ref 6–20)
Bilirubin Total: 0.3 mg/dL (ref 0.0–1.2)
CO2: 19 mmol/L — ABNORMAL LOW (ref 20–29)
Calcium: 9.2 mg/dL (ref 8.7–10.2)
Chloride: 104 mmol/L (ref 96–106)
Creatinine, Ser: 0.81 mg/dL (ref 0.57–1.00)
GFR calc Af Amer: 119 mL/min/{1.73_m2} (ref >59)
GFR calc non Af Amer: 103 mL/min/{1.73_m2} (ref >59)
Globulin, Total: 2.6 g/dL (ref 1.5–4.5)
Glucose: 88 mg/dL (ref 65–99)
Potassium: 4.2 mmol/L (ref 3.5–5.2)
Sodium: 136 mmol/L (ref 134–144)
Total Protein: 6.4 g/dL (ref 6.0–8.5)

## 2019-12-16 LAB — ABO/RH: ABO/RH(D): O POS

## 2019-12-16 LAB — TYPE AND SCREEN
ABO/RH(D): O POS
Antibody Screen: NEGATIVE

## 2019-12-16 LAB — RESPIRATORY PANEL BY RT PCR (FLU A&B, COVID)
Influenza A by PCR: NEGATIVE
Influenza B by PCR: NEGATIVE
SARS Coronavirus 2 by RT PCR: NEGATIVE

## 2019-12-16 LAB — OB RESULTS CONSOLE GBS: GBS: POSITIVE

## 2019-12-16 LAB — BILE ACIDS, TOTAL: Bile Acids Total: 4.3 umol/L (ref 0.0–10.0)

## 2019-12-16 MED ORDER — IBUPROFEN 600 MG PO TABS
600.0000 mg | ORAL_TABLET | Freq: Four times a day (QID) | ORAL | Status: DC
  Administered 2019-12-16 – 2019-12-18 (×7): 600 mg via ORAL
  Filled 2019-12-16 (×7): qty 1

## 2019-12-16 MED ORDER — OXYCODONE-ACETAMINOPHEN 5-325 MG PO TABS: 2.0000 | ORAL_TABLET | ORAL | Status: DC | PRN

## 2019-12-16 MED ORDER — SIMETHICONE 80 MG PO CHEW: 80.0000 mg | CHEWABLE_TABLET | ORAL | Status: DC | PRN

## 2019-12-16 MED ORDER — OXYTOCIN-SODIUM CHLORIDE 30-0.9 UT/500ML-% IV SOLN
2.5000 [IU]/h | INTRAVENOUS | Status: DC
  Administered 2019-12-16: 2.5 [IU]/h via INTRAVENOUS
  Filled 2019-12-16: qty 500

## 2019-12-16 MED ORDER — OXYCODONE-ACETAMINOPHEN 5-325 MG PO TABS: 1.0000 | ORAL_TABLET | ORAL | Status: DC | PRN

## 2019-12-16 MED ORDER — LACTATED RINGERS IV SOLN: INTRAVENOUS | Status: DC

## 2019-12-16 MED ORDER — ACETAMINOPHEN 325 MG PO TABS
650.0000 mg | ORAL_TABLET | Freq: Four times a day (QID) | ORAL | Status: DC
  Administered 2019-12-16 – 2019-12-18 (×7): 650 mg via ORAL
  Filled 2019-12-16 (×7): qty 2

## 2019-12-16 MED ORDER — DIBUCAINE (PERIANAL) 1 % EX OINT: 1.0000 "application " | TOPICAL_OINTMENT | CUTANEOUS | Status: DC | PRN

## 2019-12-16 MED ORDER — OXYTOCIN BOLUS FROM INFUSION
333.0000 mL | Freq: Once | INTRAVENOUS | Status: AC
  Administered 2019-12-16: 333 mL via INTRAVENOUS

## 2019-12-16 MED ORDER — SOD CITRATE-CITRIC ACID 500-334 MG/5ML PO SOLN: 30.0000 mL | ORAL | Status: DC | PRN

## 2019-12-16 MED ORDER — WITCH HAZEL-GLYCERIN EX PADS: 1.0000 "application " | MEDICATED_PAD | CUTANEOUS | Status: DC | PRN

## 2019-12-16 MED ORDER — DIPHENHYDRAMINE HCL 25 MG PO CAPS: 25.0000 mg | ORAL_CAPSULE | Freq: Four times a day (QID) | ORAL | Status: DC | PRN

## 2019-12-16 MED ORDER — SODIUM CHLORIDE 0.9 % IV SOLN
2.0000 g | Freq: Four times a day (QID) | INTRAVENOUS | Status: DC
  Administered 2019-12-16: 2 g via INTRAVENOUS
  Filled 2019-12-16: qty 2000

## 2019-12-16 MED ORDER — ACETAMINOPHEN 325 MG PO TABS: 650.0000 mg | ORAL_TABLET | ORAL | Status: DC | PRN

## 2019-12-16 MED ORDER — SENNOSIDES-DOCUSATE SODIUM 8.6-50 MG PO TABS
2.0000 | ORAL_TABLET | ORAL | Status: DC
  Administered 2019-12-16 – 2019-12-17 (×2): 2 via ORAL
  Filled 2019-12-16 (×2): qty 2

## 2019-12-16 MED ORDER — ONDANSETRON HCL 4 MG/2ML IJ SOLN: 4.0000 mg | Freq: Four times a day (QID) | INTRAMUSCULAR | Status: DC | PRN

## 2019-12-16 MED ORDER — LACTATED RINGERS IV SOLN
500.0000 mL | INTRAVENOUS | Status: DC | PRN
  Administered 2019-12-16: 1000 mL via INTRAVENOUS

## 2019-12-16 MED ORDER — ONDANSETRON HCL 4 MG/2ML IJ SOLN: 4.0000 mg | INTRAMUSCULAR | Status: DC | PRN

## 2019-12-16 MED ORDER — ONDANSETRON HCL 4 MG PO TABS: 4.0000 mg | ORAL_TABLET | ORAL | Status: DC | PRN

## 2019-12-16 MED ORDER — BENZOCAINE-MENTHOL 20-0.5 % EX AERO: 1.0000 "application " | INHALATION_SPRAY | CUTANEOUS | Status: DC | PRN

## 2019-12-16 MED ORDER — PRENATAL MULTIVITAMIN CH
1.0000 | ORAL_TABLET | Freq: Every day | ORAL | Status: DC
  Administered 2019-12-17 – 2019-12-18 (×2): 1 via ORAL
  Filled 2019-12-16 (×2): qty 1

## 2019-12-16 MED ORDER — LIDOCAINE HCL (PF) 1 % IJ SOLN
30.0000 mL | INTRAMUSCULAR | Status: DC | PRN
  Filled 2019-12-16: qty 30

## 2019-12-16 MED ORDER — TETANUS-DIPHTH-ACELL PERTUSSIS 5-2.5-18.5 LF-MCG/0.5 IM SUSP: 0.5000 mL | Freq: Once | INTRAMUSCULAR | Status: DC

## 2019-12-16 MED ORDER — COCONUT OIL OIL
1.0000 "application " | TOPICAL_OIL | Status: DC | PRN
  Administered 2019-12-18: 1 via TOPICAL

## 2019-12-16 NOTE — Discharge Summary (Signed)
Postpartum Discharge Summary    Patient Name: Andrea Vargas DOB: 1995/08/30 MRN: [id number]  Date of admission: 12/16/2019 Delivery date:12/16/2019  Delivering provider: Julianne Handler  Date of discharge: 12/18/2019  Admitting diagnosis: Supervision of low-risk pregnancy, third trimester [Z34.93] Intrauterine pregnancy: [redacted]w[redacted]d    Secondary diagnosis:  Principal Problem:   Vaginal delivery Active Problems:   Supervision of low-risk pregnancy, third trimester   Diet controlled gestational diabetes mellitus   History of gestational hypertension  Additional problems: as noted above  Discharge diagnosis: Vaginal delivery                                           Post partum procedures: none Augmentation: none Complications: None  Hospital course: Onset of Labor With Vaginal Delivery      24y.o. yo [id number] 359w3das admitted in Latent Labor on 12/16/2019. Patient had an uncomplicated labor course as follows:  Membrane Rupture Time/Date: 7:07 PM ,12/16/2019   Delivery Method:Vaginal, Spontaneous  Episiotomy: None  Lacerations:  Labial  Patient had an uncomplicated postpartum course.  She is ambulating, tolerating a regular diet, passing flatus, and urinating well. Patient is discharged home in stable condition on 12/18/19.  Newborn Data: Birth date:12/16/2019  Birth time:7:42 PM  Gender:Female  Living status:Living  Apgars:9 ,9  Weight:3475 g   Magnesium Sulfate received: No BMZ received: No Rhophylac:N/A MMR:N/A T-DaP:Given prenatally Flu: Yes Transfusion:No  Physical exam  Vitals:   12/17/19 0734 12/17/19 1115 12/17/19 2100 12/18/19 0540  BP: '96/62 98/68 97/66 ' 94/60  Pulse: 60 71 68 67  Resp: '18 18 18 18  ' Temp: 98.6 F (37 C) 98.9 F (37.2 C) 97.6 F (36.4 C) 97.9 F (36.6 C)  TempSrc: Oral Oral Axillary Oral   General: alert, cooperative and no distress Lochia: appropriate Uterine Fundus: firm Incision: N/A DVT Evaluation: No evidence of  DVT seen on physical exam. No cords or calf tenderness. No significant calf/ankle edema. Labs: Lab Results  Component Value Date   WBC 10.4 12/16/2019   HGB 11.6 (L) 12/16/2019   HCT 35.9 (L) 12/16/2019   MCV 86.7 12/16/2019   PLT 248 12/16/2019   CMP Latest Ref Rng & Units 12/17/2019  Glucose 70 - 99 mg/dL 91   Edinburgh Score: Edinburgh Postnatal Depression Scale Screening Tool 12/16/2019  I have been able to laugh and see the funny side of things. 0  I have looked forward with enjoyment to things. 0  I have blamed myself unnecessarily when things went wrong. 0  I have been anxious or worried for no good reason. 0  I have felt scared or panicky for no good reason. 0  Things have been getting on top of me. 1  I have been so unhappy that I have had difficulty sleeping. 0  I have felt sad or miserable. 0  I have been so unhappy that I have been crying. 0  The thought of harming myself has occurred to me. 0  Edinburgh Postnatal Depression Scale Total 1     After visit meds:  Allergies as of 12/18/2019   No Known Allergies     Medication List    TAKE these medications   acetaminophen 325 MG tablet Commonly known as: Tylenol Take 2 tablets (650 mg total) by mouth every 6 (six) hours.   coconut oil Oil Apply 1 application topically as needed (  nipple pain).   ibuprofen 600 MG tablet Commonly known as: ADVIL Take 1 tablet (600 mg total) by mouth every 8 (eight) hours as needed.   prenatal multivitamin Tabs tablet Take 1 tablet by mouth daily at 12 noon.        Discharge home in stable condition Infant Feeding: breast and bottle Infant Disposition:home with mother Discharge instruction: per After Visit Summary and Postpartum booklet. Activity: Advance as tolerated. Pelvic rest for 6 weeks.  Diet: routine diet Future Appointments:No future appointments. Follow up Visit: Message sent to [facility] on 12/16/19 to schedule PP appt and other f/u appts  Please schedule  this patient for a In person postpartum visit in 4 weeks with the following provider: Any provider. Additional Postpartum F/U:2 hour GTT in 4-6 weeks and BP check 1 week  High risk pregnancy complicated by: T4HDQ, h/o gHTN in prior pregnancy Delivery mode:  Vaginal, Spontaneous  Anticipated Birth Control:  plan for outpatient hormonal IUD (recommended for pt to call HD given no health insurance)  12/18/2019 Randa Ngo, MD

## 2019-12-16 NOTE — MAU Note (Signed)
PT REPORTS CTX SINCE LAST NIGHT ABOUT 5 MIN APART. dENIES ANY LEAKING OR BLEEDING. GOOD FETAL MOVEMENT FELT.

## 2019-12-16 NOTE — Telephone Encounter (Signed)
OB Telephone Note Patient called at [phone] and normal CMP and bile acids normal at 4.3. d/w her re: OTC measures.   Interpreter used  [place] Bing, Montez Hageman MD Attending [facility] [employer] (Faculty Practice) 12/16/2019 Time: 1109am

## 2019-12-16 NOTE — H&P (Signed)
OBSTETRIC ADMISSION HISTORY AND PHYSICAL  Andrea Vargas is a 24 y.o. female G2P1001 with IUP at [redacted]w[redacted]d by 24 week ultrasound presenting for spontaneous onset of labor. She reports +FMs, No LOF, no VB, no blurry vision, headaches or peripheral edema, and RUQ pain.  She plans on breast and bottle feeding. She requests outpatient hormonal IUD for birth control.  She received her prenatal [facility] at [facility]   Dating: By 24 week ultrasound --->  Estimated Date of Delivery: 12/20/19  Sono:  @[redacted]w[redacted]d , CWD, normal anatomy, cephalic presentation, 2632g, 10-10-1975 EFW  Prenatal History/Complications:  -A1GDM -Left fetal pyelectasis (92mm)  Past Medical History: Past Medical History:  Diagnosis Date  . Gestational diabetes     Past Surgical History: Past Surgical History:  Procedure Laterality Date  . BREAST SURGERY      Obstetrical History: OB History    Gravida  2   Para  1   Term  1   Preterm      AB      Living  1     SAB      TAB      Ectopic      Multiple      Live Births  1           Social History Social History   Socioeconomic History  . Marital status: Married    Spouse name: Not on file  . Number of children: Not on file  . Years of education: Not on file  . Highest education level: Not on file  Occupational History  . Not on file  Tobacco Use  . Smoking status: Never Smoker  . Smokeless tobacco: Never Used  Substance and Sexual Activity  . Alcohol use: Never  . Drug use: Never  . Sexual activity: Not on file  Other Topics Concern  . Not on file  Social History Narrative  . Not on file   Social Determinants of Health   Financial Resource Strain:   . Difficulty of Paying Living Expenses: Not on file  Food Insecurity:   . Worried About 11m in the Last Year: Not on file  . Ran Out of Food in the Last Year: Not on file  Transportation Needs:   . Lack of Transportation (Medical): Not on file  . Lack of Transportation  (Non-Medical): Not on file  Physical Activity:   . Days of Exercise per Week: Not on file  . Minutes of Exercise per Session: Not on file  Stress:   . Feeling of Stress : Not on file  Social Connections:   . Frequency of Communication with Friends and Family: Not on file  . Frequency of Social Gatherings with Friends and Family: Not on file  . Attends Religious Services: Not on file  . Active Member of Clubs or Organizations: Not on file  . Attends Programme researcher, broadcasting/film/video Meetings: Not on file  . Marital Status: Not on file    Family History: No family history on file.  Allergies: No Known Allergies  Medications Prior to Admission  Medication Sig Dispense Refill Last Dose  . Prenatal Vit-Fe Fumarate-FA (PRENATAL MULTIVITAMIN) TABS tablet Take 1 tablet by mouth daily at 12 noon.        Review of Systems   All systems reviewed and negative except as stated in HPI  There were no vitals taken for this visit. General appearance: alert, cooperative and appears stated age Lungs: normal WOB Heart: regular rate  Abdomen:  soft, non-tender Extremities: no sign of DVT Presentation: cephalic Fetal monitoringBaseline: 135 bpm, Variability: Good {> 6 bpm), Accelerations: Reactive and Decelerations: Absent Uterine activityFrequency: Every 2-4 minutes Dilation: 7.5 Effacement (%): 100 Station: Plus 1 Exam by:: Woodroe Chen RN   Prenatal labs: ABO, Rh: --/--/PENDING (10/15 1639) Antibody: PENDING (10/15 1639) Rubella:  immune RPR:   non-reactive HBsAg:   non-reactive HIV:   non-reactive GBS:   positive 1 hr Glucola 179 >3hr gtt: 100/221/204/153 Genetic screening  wnl Anatomy US wnl except L fetal pyelectasis (75mm)   Prenatal Transfer Tool  Maternal Diabetes: Yes:  Diabetes Type:  Diet controlled Genetic Screening: Normal Maternal Ultrasounds/Referrals: normal except L fetal pyelectasis (73mm)  Fetal Ultrasounds or other Referrals:  None Maternal Substance Abuse:  No Significant  Maternal Medications:  None Significant Maternal Lab Results: Group B Strep positive  Results for orders placed or performed during the hospital encounter of 12/16/19 (from the past 24 hour(s))  Type and screen [facility]   Collection Time: 12/16/19  4:39 PM  Result Value Ref Range   ABO/RH(D) PENDING    Antibody Screen PENDING    Sample Expiration      12/19/2019,2359 Performed at [facility], [address], [place], [place] [address]   Respiratory Panel by RT PCR (Flu A&B, Covid) - Nasopharyngeal Swab   Collection Time: 12/16/19  5:02 PM   Specimen: Nasopharyngeal Swab  Result Value Ref Range   SARS Coronavirus 2 by RT PCR NEGATIVE NEGATIVE   Influenza A by PCR NEGATIVE NEGATIVE   Influenza B by PCR NEGATIVE NEGATIVE  CBC   Collection Time: 12/16/19  5:02 PM  Result Value Ref Range   WBC 10.4 4.0 - 10.5 K/uL   RBC 4.14 3.87 - 5.11 MIL/uL   Hemoglobin 11.6 (L) 12.0 - 15.0 g/dL   HCT 53.6 (L) 36 - 46 %   MCV 86.7 80.0 - 100.0 fL   MCH 28.0 26.0 - 34.0 pg   MCHC 32.3 30.0 - 36.0 g/dL   RDW 64.4 03.4 - 74.2 %   Platelets 248 150 - 400 K/uL   nRBC 0.0 0.0 - 0.2 %    Patient Active Problem List   Diagnosis Date Noted  . Supervision of low-risk pregnancy, third trimester 12/16/2019    Assessment/Plan:  Andrea Vargas is a 24 y.o. G2P1001 at [redacted]w[redacted]d here for spontaneous onset of labor.  #Labor: Will manage expectantly and augment as clinically indicated. #Pain: None per pt request #FWB: Category 1 strip #ID: GBS positive >ampicillin on admission given active labor #MOF: breast and bottle #MOC: desires outpatient hormonal IUD #Circ:  Desires #A1GDM: plan for q4hr BG checks in labor #L fetal pyelectasis: peds notified  Sheila Oats, MD  12/16/2019, 5:59 PM

## 2019-12-17 LAB — GLUCOSE, CAPILLARY: Glucose-Capillary: 93 mg/dL (ref 70–99)

## 2019-12-17 LAB — GLUCOSE, RANDOM: Glucose, Bld: 91 mg/dL (ref 70–99)

## 2019-12-17 LAB — RPR: RPR Ser Ql: NONREACTIVE

## 2019-12-17 NOTE — Lactation Note (Addendum)
This note was copied from a baby's chart. Lactation Consultation Note  Patient Name: Andrea Vargas Date: 12/17/2019  [name] now 33 hours old. Mom asleep on arrival.  Infant in bed with her so woke her up. Nettie Elm interpreted.  Mom reports she exclusively breastfed her last baby until that child was one and a half and got mastitis when she weaned.  Mom reports she had to have surgery on her right breast and was hospitalized. Mom reports she is doing formula with him because she does not have enough milk. Urged hand expression and spoon feeding and to always breastfeed first and only give formula if medically indicated. Discussed mom hx of mastitis.  Urged her to massage her breasts often and feel for any hard areas.  Urged to feed on cue and at least 8-12 or more times day.  Dad still sleeping. Reviewed and left [facility] breastfeeding Consultation services handout.  Urged to call lactation as needed.   Feeding    LATCH Score                   Interventions    Lactation Tools Discussed/Used     Consult Status      Andrea Vargas 12/17/2019, 10:50 PM

## 2019-12-17 NOTE — Progress Notes (Addendum)
POSTPARTUM PROGRESS NOTE  Post Partum Day 1  Subjective:  Andrea Vargas is a 24 y.o. [id number] s/p SVD at [redacted]w[redacted]d.  She reports she is doing well. No acute events overnight. She denies any problems with ambulating, voiding or po intake. Denies nausea or vomiting.  Pain is well controlled.  Lochia is minimal.  Objective: Blood pressure 96/62, pulse 60, temperature 98.6 F (37 C), temperature source Oral, resp. rate 18, unknown if currently breastfeeding.  Physical Exam:  General: alert, cooperative and no distress Chest: no respiratory distress Heart: regular rate, distal pulses intact Uterine Fundus: firm, appropriately tender DVT Evaluation: No calf swelling or tenderness Extremities: No edema Skin: warm, dry  Recent Labs    12/16/19 1702  HGB 11.6*  HCT 35.9*    Assessment/Plan: Andrea Vargas is a 24 y.o. [id number] s/p SVD at [redacted]w[redacted]d   PPD#1 - Doing well. Continue routine postpartum care.  A1GDM: AM glc 91 Hx of gHTN: normotensive Contraception: outpt IUD  Feeding: breast Circ: no Dispo: Plan for discharge tomorrow.   LOS: 1 day   Herby Abraham MD, PGY-1 OBGYN Faculty Teaching Service  12/17/2019, 8:53 AM  Provider attestation I have seen and examined this patient and agree with above documentation in the resident's note.   Post Partum Day 1  Andrea Vargas is a 24 y.o. [id number] s/p SVD.  Pt denies problems with ambulating, voiding or po intake. Pain is well controlled. Method of Feeding: breast  PE:  Gen: well appearing Heart: reg rate Lungs: normal WOB Fundus firm Ext: soft, no pain, no edema  Assessment: S/p SVD PPD #1  Plan for discharge: tomorrow  Spanish interpreter was in person for this encounter.   Judeth Horn, NP 10:47 AM

## 2019-12-18 MED ORDER — IBUPROFEN 600 MG PO TABS
600.0000 mg | ORAL_TABLET | Freq: Three times a day (TID) | ORAL | 0 refills | Status: DC | PRN
Start: 2019-12-18 — End: 2023-12-01

## 2019-12-18 MED ORDER — ACETAMINOPHEN 325 MG PO TABS
650.0000 mg | ORAL_TABLET | Freq: Four times a day (QID) | ORAL | Status: DC
Start: 2019-12-18 — End: 2023-12-01

## 2019-12-18 MED ORDER — COCONUT OIL OIL: 1.0000 "application " | TOPICAL_OIL | 0 refills | Status: DC | PRN

## 2019-12-18 NOTE — Lactation Note (Signed)
This note was copied from Andrea baby's chart. Lactation Consultation Note  Patient Name: Andrea Vargas [id number] Date: 12/18/2019 Reason for consult: Follow-up assessment;Nipple pain/trauma Type of Endocrine Disorder?: Diabetes  Follow up with 40 hours old infant with 2.73% weight loss total. Mother reports breastfeeding but does not have enough milk. Infant was bottle-fed 40 mL of formula. Reviewed hand expression and upon demonstration colostrum is easily expressed.  Mother requested Andrea manual pump. Set up and demonstrated use.  Mother mentioned some nipple discomfort and sensitivity. Talked about EBM to nipples, air-dry and applying comfort gels.  Reviewed hunger and fullness cues, signs of intake, feeding 8-12 times in 24 h period, normal newborn behavior and cluster feeding. Reviewed Lactation Services brochure and other local resources available such as WIC. Praised mother for her efforts and dedication.   Discharge Plan: 1-Breastfeeding first and supplementing as needed by guidelines.  2-Using EBM to nipples, air-dry and applying comfort gelsf. 3-Ensuring infant has Andrea deep latch and/or chin tugging to improve latch. 4-Contacting LC services for support as needed for any questions or concerns.  Maternal Data Has patient been taught Hand Expression?: Yes Does the patient have breastfeeding experience prior to this delivery?: Yes  Interventions Interventions: Breast feeding basics reviewed;Expressed milk;Comfort gels;Hand pump  Lactation Tools Discussed/Used WIC Program: Yes  Consult Status Consult Status: Complete Date: 12/18/19 Follow-up type: In-patient    Andrea Vargas Andrea Vargas 12/18/2019, 12:25 PM

## 2019-12-19 ENCOUNTER — Other Ambulatory Visit (HOSPITAL_COMMUNITY): Payer: Self-pay

## 2019-12-19 ENCOUNTER — Encounter: Payer: Self-pay | Admitting: Obstetrics and Gynecology

## 2019-12-20 ENCOUNTER — Inpatient Hospital Stay (HOSPITAL_COMMUNITY): Payer: Self-pay

## 2019-12-20 ENCOUNTER — Inpatient Hospital Stay (HOSPITAL_COMMUNITY): Admission: AD | Admit: 2019-12-20 | Payer: Self-pay | Source: Home / Self Care | Admitting: Obstetrics and Gynecology

## 2019-12-23 ENCOUNTER — Encounter: Payer: Self-pay | Admitting: Obstetrics and Gynecology

## 2020-01-17 ENCOUNTER — Other Ambulatory Visit: Payer: Self-pay

## 2020-01-17 NOTE — Progress Notes (Deleted)
g

## 2020-01-20 ENCOUNTER — Other Ambulatory Visit: Payer: Self-pay

## 2020-01-24 ENCOUNTER — Ambulatory Visit (INDEPENDENT_AMBULATORY_CARE_PROVIDER_SITE_OTHER): Payer: Self-pay | Admitting: Advanced Practice Midwife

## 2020-01-24 ENCOUNTER — Other Ambulatory Visit: Payer: Self-pay

## 2020-01-24 ENCOUNTER — Encounter: Payer: Self-pay | Admitting: Advanced Practice Midwife

## 2020-01-24 DIAGNOSIS — Z8759 Personal history of other complications of pregnancy, childbirth and the puerperium: Secondary | ICD-10-CM

## 2020-01-24 NOTE — Progress Notes (Signed)
Post Partum Visit Note  Andrea Vargas is a 24 y.o. [phone] female who presents for a postpartum visit. She is 5 weeks postpartum following a normal spontaneous vaginal delivery.  I have fully reviewed the prenatal and intrapartum course. The delivery was at 39/3 gestational weeks.  Anesthesia: none. Postpartum course has been uncomplicated. Baby is doing well. Baby is feeding by bottle - [employer] Good Start. Bleeding no bleeding. Bowel function is normal. Bladder function is normal. Patient is not sexually active. Contraception method is none. Postpartum depression screening: negative.   The pregnancy intention screening data noted above was reviewed. Potential methods of contraception were discussed. The patient elected to proceed with IUD or IUS. Patient is self pay and referred to HD for IUD. Offered OCPs as a bridge. She reports that she had surgery on her breast in [place], and was told that she can't take OCPs.    Edinburgh Postnatal Depression Scale - 01/24/20 0927      Edinburgh Postnatal Depression Scale:  In the Past 7 Days   I have been able to laugh and see the funny side of things. 0    I have looked forward with enjoyment to things. 0    I have blamed myself unnecessarily when things went wrong. 0    I have been anxious or worried for no good reason. 0    I have felt scared or panicky for no good reason. 0    Things have been getting on top of me. 0    I have been so unhappy that I have had difficulty sleeping. 0    I have felt sad or miserable. 0    I have been so unhappy that I have been crying. 0    The thought of harming myself has occurred to me. 0    Edinburgh Postnatal Depression Scale Total 0           The following portions of the patient's history were reviewed and updated as appropriate: allergies, current medications, past family history, past medical history, past social history, past surgical history and problem list.  Review of  Systems Pertinent items are noted in HPI.    Objective:  BP 114/76   Pulse (!) 58   Ht 5\' 2"  (1.575 m)   Wt 123 lb 1.6 oz (55.8 kg)   LMP 02/17/2019   Breastfeeding No   BMI 22.52 kg/m    Physical Exam Vitals and nursing note reviewed.  Constitutional:      General: She is not in acute distress. HENT:     Head: Normocephalic.  Cardiovascular:     Rate and Rhythm: Normal rate.  Pulmonary:     Effort: Pulmonary effort is normal.  Abdominal:     General: There is no distension.     Palpations: Abdomen is soft.     Comments: No diastasis   Skin:    General: Skin is warm and dry.  Neurological:     Mental Status: She is alert and oriented to person, place, and time.  Psychiatric:        Mood and Affect: Mood normal.        Behavior: Behavior normal.    Assessment:    Routine postpartum exam. Pap smear not done at today's visit.   Plan:   Essential components of care per ACOG recommendations:  1.  Mood and well being: Patient with negative depression screening today. Reviewed local resources for support.  - Patient does  not use tobacco. NA If using tobacco we discussed reduction and for recently cessation risk of relapse - hx of drug use? No  NA If yes, discussed support systems  2. Infant care and feeding:  -Patient currently breastmilk feeding? No NA If breastmilk feeding discussed return to work and pumping. If needed, patient was provided letter for work to allow for every 2-3 hr pumping breaks, and to be granted a private location to express breastmilk and refrigerated area to store breastmilk. Reviewed importance of draining breast regularly to support lactation. -Social determinants of health (SDOH) reviewed in EPIC. No concerns.  3. Sexuality, contraception and birth spacing - Patient does not want a pregnancy in the next year.  Desired family size is 3 children.  - Reviewed forms of contraception in tiered fashion. Patient desired IUD today.   - Discussed  birth spacing of 18 months  4. Sleep and fatigue -Encouraged family/partner/community support of 4 hrs of uninterrupted sleep to help with mood and fatigue  5. Physical Recovery  - Discussed patients delivery and complications - Patient had a 1st degree hemostatic laceration, perineal healing reviewed. Patient expressed understanding - Patient has urinary incontinence? No NA  Patient was referred to pelvic floor PT  - Patient is safe to resume physical and sexual activity  6.  Health Maintenance - Last pap smear done 08/2019 and was normal with negative HPV. NA, age Mammogram  61. Chronic Disease - 2 hour GTT today and will refer to PCP if abnormal  - PCP follow up  Thressa Sheller DNP, CNM  01/24/20  9:45 AM

## 2020-01-24 NOTE — Progress Notes (Signed)
Pt self pay, wants IUD. Directed to Health Dept.

## 2020-01-25 LAB — GLUCOSE TOLERANCE, 2 HOURS
Glucose, 2 hour: 114 mg/dL (ref 65–139)
Glucose, GTT - Fasting: 96 mg/dL (ref 65–99)

## 2021-07-08 ENCOUNTER — Other Ambulatory Visit: Payer: Self-pay

## 2021-07-08 DIAGNOSIS — N6310 Unspecified lump in the right breast, unspecified quadrant: Secondary | ICD-10-CM

## 2021-09-05 ENCOUNTER — Ambulatory Visit
Admission: RE | Admit: 2021-09-05 | Discharge: 2021-09-05 | Disposition: A | Discharge status: Home / Self Care | Payer: No Typology Code available for payment source | Source: Ambulatory Visit | Attending: Obstetrics and Gynecology | Admitting: Obstetrics and Gynecology

## 2021-09-05 ENCOUNTER — Other Ambulatory Visit: Payer: Self-pay

## 2021-09-05 ENCOUNTER — Ambulatory Visit: Payer: Self-pay | Admitting: *Deleted

## 2021-09-05 VITALS — BP 120/70 | Wt 126.9 lb

## 2021-09-05 DIAGNOSIS — Z1239 Encounter for other screening for malignant neoplasm of breast: Secondary | ICD-10-CM

## 2021-09-05 DIAGNOSIS — N6315 Unspecified lump in the right breast, overlapping quadrants: Secondary | ICD-10-CM

## 2021-09-05 DIAGNOSIS — N6452 Nipple discharge: Secondary | ICD-10-CM

## 2021-09-05 DIAGNOSIS — N6310 Unspecified lump in the right breast, unspecified quadrant: Secondary | ICD-10-CM

## 2021-09-05 NOTE — Patient Instructions (Signed)
Explained breast self awareness with Laureen Abrahams. Patient did not need a Pap smear today due to last Pap smear was 08/05/2019. Let her know BCCCP will cover Pap smears every 3 years unless has a history of abnormal Pap smears. Referred patient to the [facility] Center of [facility] for a breast ultrasound. Appointment scheduled Thursday, September 05, 2021 at 1410. Patient aware of appointment and will be there. Steele Berg Julianne Rice verbalized understanding.  Russ Looper, Kathaleen Maser, RN 12:55 PM

## 2021-09-05 NOTE — Progress Notes (Signed)
Ms. Zuleyka Kloc Lonni Dirden is a 26 y.o. female who presents to [facility] clinic today with complaint of right breast lump x 4 months and left milky breast discharge x 5 days when expressed.    Pap Smear: Pap smear not completed today. Last Pap smear was 08/05/2019 at the [facility] clinic and was normal. Per patient has no history of an abnormal Pap smear. Last Pap smear result is available in Epic.   Physical exam: Breasts Breasts symmetrical. No skin abnormalities left breast. Scar observed right breast above areola from history of lumpectomy due to mastitis in 2018. No nipple retraction bilateral breasts. No nipple discharge bilateral breasts. Unable to express any nipple discharge from the left breast on exam. No lymphadenopathy. No lumps palpated left breast. Palpated a pea sized lump within the right breast at 12 o'clock 1 cm from the nipple under breast scar. No complaints of pain or tenderness on exam.      Pelvic/Bimanual Pap is not indicated today per BCCCP guidelines.   Smoking History: Patient has never smoked.  Patient Navigation: Patient education provided. Access to services provided for patient through [facility] [place] program. Spanish interpreter Alene Mires from [facility] provided.    Breast and Cervical Cancer Risk Assessment: Patient does not have family history of breast cancer, known genetic mutations, or radiation treatment to the chest before age 48. Patient does not have history of cervical dysplasia, immunocompromised, or DES exposure in-utero. Breast cancer risk assessment completed. No breast cancer risk calculated due to patient is less than 35 years old.  Risk Assessment     Risk Scores       09/05/2021   Last edited by: Narda Rutherford, LPN   5-year risk:    Lifetime risk:             A: BCCCP exam without pap smear Complaint of right breast lump and left breast discharge.  P: Referred patient to the [facility] of [facility] for a breast  ultrasound. Appointment scheduled Thursday, September 05, 2021 at 1410.  Priscille Heidelberg, RN 09/05/2021 12:55 PM

## 2023-01-15 ENCOUNTER — Other Ambulatory Visit: Payer: Self-pay

## 2023-01-15 ENCOUNTER — Telehealth: Payer: Self-pay

## 2023-01-15 DIAGNOSIS — N6315 Unspecified lump in the right breast, overlapping quadrants: Secondary | ICD-10-CM

## 2023-01-15 DIAGNOSIS — N6452 Nipple discharge: Secondary | ICD-10-CM

## 2023-01-15 NOTE — Telephone Encounter (Addendum)
Via Andrea Vargas, [place] Spanish Interpreter, called and stated that the mass in her Right breast has increased in size, has breast discharge-white from both breasts with expression x 4 months. Patient informed to stop expressing breasts. Patient denies any pain, skin changes, family history of breast cancer. Patient states she had the nexplanon implant removed 6 months ago, menstrual cycle has not restarted, noticed weight gain, increase in size of both breasts, all home hcg tests are negative. Patient to be scheduled for BCCCP appointment.

## 2023-02-03 ENCOUNTER — Ambulatory Visit
Admission: RE | Admit: 2023-02-03 | Discharge: 2023-02-03 | Disposition: A | Discharge status: Home / Self Care | Payer: No Typology Code available for payment source | Source: Ambulatory Visit | Attending: Obstetrics and Gynecology | Admitting: Obstetrics and Gynecology

## 2023-02-03 ENCOUNTER — Ambulatory Visit: Payer: Self-pay | Admitting: *Deleted

## 2023-02-03 VITALS — BP 112/82 | Wt 139.0 lb

## 2023-02-03 DIAGNOSIS — N6315 Unspecified lump in the right breast, overlapping quadrants: Secondary | ICD-10-CM

## 2023-02-03 DIAGNOSIS — N6452 Nipple discharge: Secondary | ICD-10-CM

## 2023-02-03 DIAGNOSIS — Z01419 Encounter for gynecological examination (general) (routine) without abnormal findings: Secondary | ICD-10-CM

## 2023-02-03 NOTE — Patient Instructions (Addendum)
Explained breast self awareness with Laureen Abrahams. Pap smear completed today. Let her know BCCCP will cover Pap smears every 3 years unless has a history of abnormal Pap smears. Referred patient to the [facility] of [facility] for a bilateral breast ultrasound. Appointment scheduled Tuesday, February 03, 2023 at 1410. Patient aware of appointment and will be there. Let patient know will follow up with her within the next couple weeks with results of Pap smear by letter or phone. Steele Berg Julianne Rice verbalized understanding.  Gehrig Patras, Kathaleen Maser, RN 10:07 AM

## 2023-02-03 NOTE — Progress Notes (Addendum)
[name] is a 27 y.o. [phone] female who presents to [facility] clinic today with complaint of right breast lump x 1 year 4 months that has increased the past 6 months and white milky bilateral breast discharge x 6 months when expressed.   Pap Smear: Pap smear completed today. Last Pap smear was 08/05/2019 at the [facility] Department clinic and was normal with negative HPV. Per patient has no history of an abnormal Pap smear. Last Pap smear result is available in Epic.    Physical exam: Breasts Breasts symmetrical. No skin abnormalities left breast. Scar observed right breast above areola from history of lumpectomy due to mastitis in 2018. No nipple retraction bilateral breasts. No nipple discharge bilateral breasts. Unable to express any nipple discharge from the bilateral breasts on exam. No lymphadenopathy. No lumps palpated left breast. Palpated a pea sized lump within the right breast at 12 o'clock 1 cm from the nipple under breast scar. Complaints of tenderness when palpated right breast lump on exam.   Pelvic/Bimanual Ext Genitalia No lesions, no swelling and no discharge observed on external genitalia.        Vagina Vagina pink and normal texture. No lesions or discharge observed in vagina.        Cervix Cervix is present. Cervix pink and of normal texture. No discharge observed.    Uterus Uterus is present and palpable. Uterus in normal position and normal size.        Adnexae Bilateral ovaries present and palpable. No tenderness on palpation.         Rectovaginal No rectal exam completed today since patient had no rectal complaints. No skin abnormalities observed on exam.     Smoking History: Patient has never smoked.   Patient Navigation: Patient education provided. Access to services provided for patient through [facility] program. Spanish interpreter Alondra Rodriguez-Hernandez from [facility] provided.   Breast and Cervical Cancer Risk  Assessment: Patient does not have family history of breast cancer, known genetic mutations, or radiation treatment to the chest before age 54. Patient does not have history of cervical dysplasia, immunocompromised, or DES exposure in-utero. Breast cancer risk assessment completed. No breast cancer risk calculated due to patient is less than 1 years old.    Risk Assessment   No risk assessment data for the current encounter  Risk Scores       09/05/2021   Last edited by: Narda Rutherford, LPN   5-year risk:    Lifetime risk:             A: BCCCP exam with pap smear Complaints of right breast lump and discharge.  P: Referred patient to the [facility] for a bilateral breast ultrasound. Appointment scheduled Tuesday, February 03, 2023 at 1410.   Priscille Heidelberg, RN 02/03/2023 10:07 AM

## 2023-02-04 LAB — CYTOLOGY - PAP: Diagnosis: NEGATIVE

## 2023-02-05 NOTE — Progress Notes (Signed)
Normal pap letter mailed to patient 02/05/23.

## 2023-10-08 LAB — GLUCOSE TOLERANCE, 1 HOUR: GTT, 1 hr: 146

## 2023-10-08 LAB — OB RESULTS CONSOLE VARICELLA ZOSTER ANTIBODY, IGG: Varicella: NON-IMMUNE/NOT IMMUNE

## 2023-10-08 LAB — OB RESULTS CONSOLE ABO/RH: RH Type: NEGATIVE

## 2023-10-08 LAB — OB RESULTS CONSOLE HEPATITIS B SURFACE ANTIGEN: Hepatitis B Surface Ag: NEGATIVE

## 2023-10-08 LAB — OB RESULTS CONSOLE RPR: RPR: NONREACTIVE

## 2023-10-08 LAB — HEPATITIS C ANTIBODY: HCV Ab: NEGATIVE

## 2023-10-08 LAB — OB RESULTS CONSOLE ANTIBODY SCREEN: Antibody Screen: NEGATIVE

## 2023-10-08 LAB — OB RESULTS CONSOLE HIV ANTIBODY (ROUTINE TESTING): HIV: NONREACTIVE

## 2023-10-08 LAB — OB RESULTS CONSOLE PLATELET COUNT: Platelets: 272

## 2023-10-08 LAB — OB RESULTS CONSOLE RUBELLA ANTIBODY, IGM: Rubella: IMMUNE

## 2023-10-08 LAB — OB RESULTS CONSOLE GC/CHLAMYDIA
Chlamydia: NEGATIVE
Neisseria Gonorrhea: NEGATIVE

## 2023-10-08 LAB — OB RESULTS CONSOLE HGB/HCT, BLOOD
HCT: 37 (ref 29–41)
Hemoglobin: 13.1

## 2023-10-09 ENCOUNTER — Other Ambulatory Visit: Payer: Self-pay

## 2023-10-09 DIAGNOSIS — Z363 Encounter for antenatal screening for malformations: Secondary | ICD-10-CM

## 2023-10-30 DIAGNOSIS — O0990 Supervision of high risk pregnancy, unspecified, unspecified trimester: Secondary | ICD-10-CM | POA: Insufficient documentation

## 2023-10-30 DIAGNOSIS — Z8632 Personal history of gestational diabetes: Secondary | ICD-10-CM | POA: Insufficient documentation

## 2023-10-30 DIAGNOSIS — O24410 Gestational diabetes mellitus in pregnancy, diet controlled: Secondary | ICD-10-CM | POA: Insufficient documentation

## 2023-10-30 DIAGNOSIS — Z8759 Personal history of other complications of pregnancy, childbirth and the puerperium: Secondary | ICD-10-CM

## 2023-10-30 DIAGNOSIS — O09899 Supervision of other high risk pregnancies, unspecified trimester: Secondary | ICD-10-CM | POA: Insufficient documentation

## 2023-10-30 DIAGNOSIS — O24419 Gestational diabetes mellitus in pregnancy, unspecified control: Secondary | ICD-10-CM | POA: Insufficient documentation

## 2023-10-30 LAB — GLUCOSE, 3 HOUR
Glucose Tolerance, 3 Hour: 132
Glucose, 1 Hour GTT: 192
Glucose, 2 Hour GTT: 163

## 2023-11-04 ENCOUNTER — Ambulatory Visit (INDEPENDENT_AMBULATORY_CARE_PROVIDER_SITE_OTHER): Payer: Self-pay | Admitting: Family Medicine

## 2023-11-04 VITALS — BP 110/73 | HR 75 | Wt 132.2 lb

## 2023-11-04 DIAGNOSIS — Z603 Acculturation difficulty: Secondary | ICD-10-CM

## 2023-11-04 DIAGNOSIS — O24410 Gestational diabetes mellitus in pregnancy, diet controlled: Secondary | ICD-10-CM

## 2023-11-04 DIAGNOSIS — Z8759 Personal history of other complications of pregnancy, childbirth and the puerperium: Secondary | ICD-10-CM

## 2023-11-04 DIAGNOSIS — Z3A15 15 weeks gestation of pregnancy: Secondary | ICD-10-CM

## 2023-11-04 DIAGNOSIS — O0992 Supervision of high risk pregnancy, unspecified, second trimester: Secondary | ICD-10-CM

## 2023-11-04 DIAGNOSIS — Z758 Other problems related to medical facilities and other health care: Secondary | ICD-10-CM

## 2023-11-04 DIAGNOSIS — Z1332 Encounter for screening for maternal depression: Secondary | ICD-10-CM

## 2023-11-04 DIAGNOSIS — O0990 Supervision of high risk pregnancy, unspecified, unspecified trimester: Secondary | ICD-10-CM

## 2023-11-05 LAB — PROTEIN / CREATININE RATIO, URINE
Creatinine, Urine: 85.6 mg/dL
Protein, Ur: 6.8 mg/dL
Protein/Creat Ratio: 79 mg/g{creat} (ref 0–200)

## 2023-11-05 MED ORDER — ASPIRIN 81 MG PO TBEC: 81.0000 mg | DELAYED_RELEASE_TABLET | Freq: Every day | ORAL | 12 refills | Status: DC

## 2023-11-05 NOTE — Progress Notes (Signed)
 Patient was seen for Gestational Diabetes on 11/12/2023  Start time 1459 and End time 1645  Estimated due date: 04/24/2024; [redacted]w[redacted]d  Spanish from [employer]: [id number]  Clinical: Medications: [employer]ter:   Medical History:  Past Medical History:  Diagnosis Date   Diet controlled gestational diabetes mellitus (GDM) in third trimester 09/16/2019   GBS (group B Streptococcus carrier), +RV culture, currently pregnant 12/06/2019   Gestational diabetes    History of gestational diabetes mellitus (GDM) 10/30/2023   History of gestational hypertension    left fetal pyelectasis, antepartum 09/16/2019   7mm on 9/21     Left urinary tract dilation  F/u scheduled     Vaginal delivery 12/16/2019   Labs: OGTT fasting none, 1 hour 192, 2 hour 163, 3 hour 132 on 10/12/2023 No results found for: HGBA1C   Dietary and Lifestyle History: Pt presents today alone. 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 Pt reports she does the shopping and cooking for a family of three. Pt reports previous GDM with her last pregnancy and states this was controlled with her diet successfully.  Pt reports eating out once or less times weekly. Pt reports she had a snack of fruit and bread today before coming in to the office today. Pt denies intake of sugary sweetened beverages. All Pt's questions were answered during this encounter.   Physical Activity: walking 3/d/w for 10-15 minutes Stress: 0 out of 10  Sleep: good  24 hr Recall:  First Meal: ~ 1 cup of unsweetened almond milk, 2 eggs Snack:  none Second meal:  chicken, rice, broccoli, asparagus, water  Snack:  ~1:30 pm: berries, bread Third meal:  rice, beans, water  Snack:  none Beverages:  water, unsweetened almond milk  NUTRITION INTERVENTION  Nutrition education (E-1) on the following topics:   Initial Follow-up  [x]  []  Definition of Gestational Diabetes [x]  []  Why dietary management is important in controlling blood  glucose [x]  []  Effects each nutrient has on blood glucose levels [x]  []  Simple carbohydrates vs complex carbohydrates [x]  []  Fluid intake [x]  []  Creating a balanced meal plan [x]  []  Carbohydrate counting  [x]  []  When to check blood glucose levels [x]  []  Proper blood glucose monitoring techniques [x]  []  Effect of stress and stress reduction techniques  [x]  []  Exercise effect on blood glucose levels, appropriate exercise during pregnancy [x]  []  Importance of limiting caffeine and abstaining from alcohol and smoking [x]  []  Medications used for blood sugar control during pregnancy [x]  []  Hypoglycemia and rule of 15 []  []  Postpartum self care  Blood glucose monitor given: [employer] Code Lot # [id number] A-4 Exp: 12/31/2024 CBG: 140 mg/dL, reported as pre meal per Pt reporting   Patient instructed to monitor glucose levels: QID FBS: 60 - <= 95 mg/dL; 2 hour: <= 879 mg/dL  Patient received handouts: Spanish  Nutrition Diabetes and Pregnancy Carbohydrate Counting List Blood glucose log Snack ideas for diabetes during pregnancy Plate Planner  Patient will be seen for follow-up: 12/15/2023

## 2023-11-05 NOTE — Progress Notes (Signed)
 Subjective:   Andrea Vargas is a 28 y.o. G3P2002 at 107w4d by LMP being seen today for her first obstetrical visit.  Her obstetrical history is significant for gestational diabetes. Patient does intend to breast feed. Pregnancy history fully reviewed.  Patient reports no complaints.  HISTORY: OB History  Gravida Para Term Preterm AB Living  3 2 2  0 0 2  SAB IAB Ectopic Multiple Live Births  0 0 0 0 2    # Outcome Date GA Lbr Len/2nd Weight Sex Type Anes PTL Lv  3 Current           2 Term 12/16/19 [redacted]w[redacted]d 02:56 / 00:39 7 lb 10.6 oz (3.475 kg) M Vag-Spont None  LIV     Name: Andrea Vargas     Apgar1: 9  Apgar5: 9  1 Term 08/26/14 [redacted]w[redacted]d   M Vag-Spont   LIV   Last pap smear was 02/03/2023 and was normal Past Medical History:  Diagnosis Date   Diet controlled gestational diabetes mellitus (GDM) in third trimester 09/16/2019   GBS (group B Streptococcus carrier), +RV culture, currently pregnant 12/06/2019   Gestational diabetes    History of gestational diabetes mellitus (GDM) 10/30/2023   History of gestational hypertension    left fetal pyelectasis, antepartum 09/16/2019   7mm on 9/21     Left urinary tract dilation  F/u scheduled     Vaginal delivery 12/16/2019   Past Surgical History:  Procedure Laterality Date   BREAST SURGERY     incision and drainage of breast     due to mastitis   Family History  Problem Relation Age of Onset   Diabetes Mother    Heart Problems Mother    Social History   Tobacco Use   Smoking status: Never   Smokeless tobacco: Never  Vaping Use   Vaping status: Never Used  Substance Use Topics   Alcohol use: Never   Drug use: Never   Allergies  Allergen Reactions   Dust Mite Extract Itching   Current Outpatient Medications on File Prior to Visit  Medication Sig Dispense Refill   Prenatal Vit-Fe Fumarate-FA (PRENATAL VITAMINS PO) Take 1 tablet by mouth daily.     acetaminophen  (TYLENOL ) 325 MG tablet Take 2 tablets  (650 mg total) by mouth every 6 (six) hours. (Patient not taking: Reported on 02/03/2023)     coconut oil OIL Apply 1 application topically as needed (nipple pain). (Patient not taking: Reported on 02/03/2023)  0   ibuprofen  (ADVIL ) 600 MG tablet Take 1 tablet (600 mg total) by mouth every 8 (eight) hours as needed. (Patient not taking: Reported on 02/03/2023) 30 tablet 0   Prenatal Vit-Fe Fumarate-FA (PRENATAL MULTIVITAMIN) TABS tablet Take 1 tablet by mouth daily at 12 noon. (Patient not taking: Reported on 02/03/2023)     No current facility-administered medications on file prior to visit.     Exam   Vitals:   11/04/23 1010  BP: 110/73  Pulse: 75  Weight: 132 lb 3.2 oz (60 kg)   Fetal Heart Rate (bpm): 152  System: General: well-developed, well-nourished female in no acute distress   Skin: normal coloration and turgor, no rashes   Neurologic: oriented, normal, negative, normal mood   Extremities: normal strength, tone, and muscle mass, ROM of all joints is normal   HEENT PERRLA, extraocular movement intact and sclera clear, anicteric   Mouth/Teeth mucous membranes moist, pharynx normal without lesions and dental hygiene good  Neck supple and no masses   Cardiovascular: regular rate and rhythm   Respiratory:  no respiratory distress, normal breath sounds   Abdomen: soft, non-tender; bowel sounds normal; no masses,  no organomegaly     Assessment:   Pregnancy: [id number] Patient Active Problem List   Diagnosis Date Noted   Maternal varicella, non-immune 10/30/2023   GDM (gestational diabetes mellitus) 10/30/2023   Supervision of high risk pregnancy, antepartum 10/30/2023   History of gestational hypertension 12/16/2019   Language barrier 11/03/2019   History of gestational hypertension 09/16/2019     Plan:  1. Supervision of high risk pregnancy, antepartum (Primary) FHR BP appropriate today - AFP, Serum, Open Spina Bifida - Ambulatory referral to Nutrition and Diabetic  Education  2. Language barrier Spanish interpreter used for entire visit  3. Diet controlled gestational diabetes mellitus (GDM) in first trimester Failed 1 hour as well as 3-hour GTT at health department.  Has yet to see diabetes educator and is not checking blood sugars yet.  Referral placed for diabetic educator. - Ambulatory referral to Nutrition and Diabetic Education  4. History of gestational hypertension BP within normal limits today.  Patient adamant she has not had elevated blood pressures in pregnancy.  It is documented from her first pregnancy.  Unable to see any actual elevated blood pressures.  Will check baseline labs.  Recommend daily ASA because she has family history of first-degree relatives with blood pressure issues in pregnancy and Hispanic. - Comp Met (CMET) - Protein / creatinine ratio, urine  5. [redacted] weeks gestation of pregnancy   Initial labs drawn. Continue prenatal vitamins. Genetic Screening discussed, NIPS: Previously collected. Ultrasound discussed; fetal anatomic survey: Previously scheduled. Problem list reviewed and updated. The nature of [facility] with multiple MDs and other Advanced Practice Providers was explained to patient; also emphasized that residents, students are part of our team. Routine obstetric precautions reviewed. No follow-ups on file.

## 2023-11-06 LAB — COMPREHENSIVE METABOLIC PANEL WITH GFR
ALT: 21 IU/L (ref 0–32)
AST: 18 IU/L (ref 0–40)
Albumin: 4 g/dL (ref 4.0–5.0)
Alkaline Phosphatase: 69 IU/L (ref 44–121)
BUN/Creatinine Ratio: 16 (ref 9–23)
BUN: 8 mg/dL (ref 6–20)
Bilirubin Total: 0.2 mg/dL (ref 0.0–1.2)
CO2: 18 mmol/L — ABNORMAL LOW (ref 20–29)
Calcium: 8.8 mg/dL (ref 8.7–10.2)
Chloride: 101 mmol/L (ref 96–106)
Creatinine, Ser: 0.51 mg/dL — ABNORMAL LOW (ref 0.57–1.00)
Globulin, Total: 2.6 g/dL (ref 1.5–4.5)
Glucose: 85 mg/dL (ref 70–99)
Potassium: 3.8 mmol/L (ref 3.5–5.2)
Sodium: 136 mmol/L (ref 134–144)
Total Protein: 6.6 g/dL (ref 6.0–8.5)
eGFR: 132 mL/min/1.73 (ref >59)

## 2023-11-06 LAB — AFP, SERUM, OPEN SPINA BIFIDA
AFP MoM: 0.63
AFP Value: 20.9 ng/mL
Gest. Age on Collection Date: 15.4 wk
Maternal Age At EDD: 27.1 a
OSBR Risk 1 IN: 10000
Test Results:: NEGATIVE
Weight: 132 [lb_av]

## 2023-11-12 ENCOUNTER — Other Ambulatory Visit: Payer: Self-pay

## 2023-11-12 ENCOUNTER — Ambulatory Visit (INDEPENDENT_AMBULATORY_CARE_PROVIDER_SITE_OTHER): Payer: Self-pay | Admitting: Dietician

## 2023-11-12 ENCOUNTER — Encounter: Payer: Self-pay | Attending: Family Medicine | Admitting: Dietician

## 2023-11-12 DIAGNOSIS — O24410 Gestational diabetes mellitus in pregnancy, diet controlled: Secondary | ICD-10-CM

## 2023-11-12 DIAGNOSIS — Z3A16 16 weeks gestation of pregnancy: Secondary | ICD-10-CM | POA: Insufficient documentation

## 2023-11-12 DIAGNOSIS — O0992 Supervision of high risk pregnancy, unspecified, second trimester: Secondary | ICD-10-CM | POA: Insufficient documentation

## 2023-11-12 DIAGNOSIS — Z713 Dietary counseling and surveillance: Secondary | ICD-10-CM | POA: Insufficient documentation

## 2023-11-12 DIAGNOSIS — O0990 Supervision of high risk pregnancy, unspecified, unspecified trimester: Secondary | ICD-10-CM

## 2023-12-01 ENCOUNTER — Ambulatory Visit (INDEPENDENT_AMBULATORY_CARE_PROVIDER_SITE_OTHER): Payer: MEDICAID | Admitting: Obstetrics and Gynecology

## 2023-12-01 VITALS — BP 101/71 | HR 93 | Wt 136.0 lb

## 2023-12-01 DIAGNOSIS — Z23 Encounter for immunization: Secondary | ICD-10-CM

## 2023-12-01 DIAGNOSIS — O09892 Supervision of other high risk pregnancies, second trimester: Secondary | ICD-10-CM

## 2023-12-01 DIAGNOSIS — Z3A19 19 weeks gestation of pregnancy: Secondary | ICD-10-CM

## 2023-12-01 DIAGNOSIS — O0990 Supervision of high risk pregnancy, unspecified, unspecified trimester: Secondary | ICD-10-CM

## 2023-12-01 DIAGNOSIS — Z758 Other problems related to medical facilities and other health care: Secondary | ICD-10-CM

## 2023-12-01 DIAGNOSIS — O24410 Gestational diabetes mellitus in pregnancy, diet controlled: Secondary | ICD-10-CM | POA: Diagnosis not present

## 2023-12-01 DIAGNOSIS — O0992 Supervision of high risk pregnancy, unspecified, second trimester: Secondary | ICD-10-CM | POA: Diagnosis not present

## 2023-12-01 DIAGNOSIS — Z8759 Personal history of other complications of pregnancy, childbirth and the puerperium: Secondary | ICD-10-CM

## 2023-12-01 DIAGNOSIS — Z603 Acculturation difficulty: Secondary | ICD-10-CM

## 2023-12-01 DIAGNOSIS — Z2839 Other underimmunization status: Secondary | ICD-10-CM

## 2023-12-01 NOTE — Patient Instructions (Signed)
 Influenza Vaccine Injection Qu es este medicamento? LA VACUNA CONTRA LA INFLUENZA reduce el riesgo de contraer influenza (gripe). No trata la influenza. Contina siendo posible contraer influenza despus de recibir Designer, television/film set, pero los sntomas podran ser menos graves o durar menos [place]. Acta [place] al sistema inmunolgico a aprender a Industrial/product designer una futura infeccin. Este medicamento puede ser utilizado para otros usos; si tiene alguna pregunta consulte con su proveedor de atencin mdica o con su farmacutico. MARCAS COMUNES: Afluria Trivalent, FLUAD Trivalent, Fluarix Trivalent, Flublok Trivalent, FLUCELVAX Trivalent, Flulaval Trivalent, Fluzone Trivalent Qu le debo informar a mi profesional de la salud antes de tomar este medicamento? Necesitan saber si usted presenta alguno de los [address] o situaciones: Trastorno de [place], como la hemofilia Gonzales o infeccin sndrome de Guillain-Barre u otras afecciones neurolgicas Problemas del sistema inmunolgico Infeccin con el virus de inmunodeficiencia humana (VIH) o SIDA Cantidad baja de plaquetas en sangre Esclerosis mltiple Una reaccin alrgica o inusual a la vacuna contra el virus de la influenza, al ltex, a otros medicamentos, alimentos, colorantes o conservantes Distintas marcas comerciales de vacunas contienen distintos alrgenos. Algunas pueden contener ltex o huevos. Hable con su equipo de atencin sobre sus alergias para asegurarse de recibir la vacuna adecuada. Si est embarazada o buscando quedar embarazada Si est amamantando a un beb Cmo debo utilizar este medicamento? Esta vacuna se inyecta en un msculo o debajo de la piel. Lo administra su equipo de atencin. Recibir una copia de informacin escrita sobre la vacuna antes de cada vacuna. Asegrese de leer este folleto cuidadosamente cada vez. Esta informacin puede cambiar frecuentemente. Hable con su equipo de atencin para ver qu vacunas son  adecuadas para usted. Algunas vacunas no deben usarse en personas de cualquier edad. Sobredosis: Pngase en contacto inmediatamente con un centro toxicolgico o una sala de urgencia si usted cree que haya tomado demasiado medicamento.<br>ATENCIN: [employer] es solo para usted. No comparta este medicamento con nadie. Qu sucede si me olvido de una dosis? No se aplica en este caso. Qu puede interactuar con este medicamento? Ciertos medicamentos que disminuyen la funcin del sistema inmunolgico, tales como etanercept, anakinra, infliximab, adalimumab Ciertos medicamentos que previenen o tratan los cogulos sanguneos, como warfarina Quimioterapia o terapia de radiacin Marine scientist Medicamentos esteroideos, tales como la prednisona o la cortisona [place] Vacunas Puede ser que esta lista no menciona todas las posibles interacciones. Informe a su profesional de [employer] de [employer] productos a base de hierbas, medicamentos de [place] o suplementos nutritivos que est tomando. Si usted fuma, consume bebidas alcohlicas o si utiliza drogas ilegales, indqueselo tambin a su profesional de [employer]. Algunas sustancias pueden interactuar con su medicamento. A qu debo estar atento al usar [employer]? Informe a su equipo de atencin cualquier efecto secundario que no desaparece. Llame a su equipo de atencin si tiene cualquier sntoma inusual dentro de las 6 semanas de recibir esta vacuna. Igualmente podr contraer gripe, pero la enfermedad por lo general no es tan grave. La vacuna no puede producir gripe. La vacuna no ofrece proteccin contra resfros u otras enfermedades que pueden causar fiebre. Es necesario aplicarse la vacuna todos los [place]. Qu efectos secundarios puedo tener al [employer] este medicamento? Efectos secundarios que debe informar a su equipo de atencin tan pronto como sea posible: Reacciones alrgicas: erupcin cutnea, comezn/picazn, urticaria, hinchazn de la  cara, los labios, la lengua o la garganta Efectos secundarios que generalmente no requieren atencin mdica (debe informarlos a su equipo de Visual merchandiser  si persisten o si son molestos): [facility] doctor de Public house manager en las articulaciones Prdida del apetito Dolor muscular Nuseas Dolor, enrojecimiento o Marketing executive de la inyeccin Puede ser que esta lista no menciona todos los posibles efectos secundarios. Comunquese a su mdico por asesoramiento mdico [employer]. Usted puede informar los efectos secundarios a la FDA por telfono al 1-800-FDA-1088. Dnde debo guardar mi medicina? La vacuna la administra solamente su equipo de atencin. No se guarda en su casa. ATENCIN: Este folleto es un resumen. Puede ser que no cubra toda la posible informacin. Si usted tiene preguntas acerca de esta medicina, consulte con su mdico, su farmacutico o su profesional de Radiographer, therapeutic.  2024 Elsevier/Gold Standard (2022-03-25 00:00:00)

## 2023-12-01 NOTE — Progress Notes (Signed)
   PRENATAL VISIT NOTE  Subjective:  Andrea Vargas is a 28 y.o. G3P2002 at [redacted]w[redacted]d being seen today for ongoing prenatal care.  She is currently monitored for the following issues for this high-risk pregnancy and has History of gestational hypertension; Language barrier; Maternal varicella, non-immune; GDM (gestational diabetes mellitus); and Supervision of high risk pregnancy, antepartum on their problem list.  Patient doing well with no acute concerns today. She reports no complaints.   . Vag. Bleeding: None.  Movement: Absent. Denies leaking of fluid.   The following portions of the patient's history were reviewed and updated as appropriate: allergies, current medications, past family history, past medical history, past social history, past surgical history and problem list. Problem list updated.  Objective:   Vitals:   12/01/23 1626  BP: 101/71  Pulse: 93  Weight: 136 lb (61.7 kg)    Fetal Status: Fetal Heart Rate (bpm): 140 Fundal Height: 19 cm Movement: Absent     General:  Alert, oriented and cooperative. Patient is in no acute distress.  Skin: Skin is warm and dry. No rash noted.   Cardiovascular: Normal heart rate noted  Respiratory: Normal respiratory effort, no problems with respiration noted  Abdomen: Soft, gravid, appropriate for gestational age.  Pain/Pressure: Absent     Pelvic: Cervical exam deferred        Extremities: Normal range of motion.  Edema: None  Mental Status:  Normal mood and affect. Normal behavior. Normal judgment and thought content.   Assessment and Plan:  Pregnancy: G3P2002 at [redacted]w[redacted]d  1. Supervision of high risk pregnancy, antepartum (Primary) Continue routine prenatal care - Flu vaccine trivalent PF, 6mos and older(Flulaval,Afluria,Fluarix,Fluzone)  2. [redacted] weeks gestation of pregnancy   3. Diet controlled gestational diabetes mellitus (GDM) in second trimester FBS: 88-98, most in range PPBS: 76-120  Good control with diet, will  monitor  4. Maternal varicella, non-immune Treat after delivery  5. Language barrier Live interpreter present  6. History of gestational hypertension No s/sx of HTN  Preterm labor symptoms and general obstetric precautions including but not limited to vaginal bleeding, contractions, leaking of fluid and fetal movement were reviewed in detail with the patient.  Please refer to After Visit Summary for other counseling recommendations.   Return in about 4 weeks (around 12/29/2023) for [facility], in person.   Jerilynn Buddle, MD Faculty Attending Center for [facility]

## 2023-12-10 ENCOUNTER — Ambulatory Visit: Payer: MEDICAID | Attending: Obstetrics and Gynecology

## 2023-12-10 ENCOUNTER — Other Ambulatory Visit: Payer: Self-pay

## 2023-12-10 ENCOUNTER — Ambulatory Visit: Payer: MEDICAID | Admitting: Maternal & Fetal Medicine

## 2023-12-10 VITALS — BP 110/71 | HR 90

## 2023-12-10 DIAGNOSIS — O09292 Supervision of pregnancy with other poor reproductive or obstetric history, second trimester: Secondary | ICD-10-CM

## 2023-12-10 DIAGNOSIS — O24410 Gestational diabetes mellitus in pregnancy, diet controlled: Secondary | ICD-10-CM | POA: Diagnosis present

## 2023-12-10 DIAGNOSIS — Z363 Encounter for antenatal screening for malformations: Secondary | ICD-10-CM | POA: Insufficient documentation

## 2023-12-10 DIAGNOSIS — Z3A19 19 weeks gestation of pregnancy: Secondary | ICD-10-CM | POA: Diagnosis not present

## 2023-12-10 DIAGNOSIS — Z2839 Other underimmunization status: Secondary | ICD-10-CM | POA: Diagnosis not present

## 2023-12-10 DIAGNOSIS — O24420 Gestational diabetes mellitus in childbirth, diet controlled: Secondary | ICD-10-CM

## 2023-12-10 DIAGNOSIS — O0990 Supervision of high risk pregnancy, unspecified, unspecified trimester: Secondary | ICD-10-CM

## 2023-12-10 MED ORDER — ASPIRIN 81 MG PO TBEC: 81.0000 mg | DELAYED_RELEASE_TABLET | Freq: Every day | ORAL | 1 refills | Status: AC

## 2023-12-10 NOTE — Progress Notes (Signed)
 Patient information  Patient Name: Andrea Vargas  Patient MRN:   [id number]  Referring practice: MFM Referring Provider: [facility])  Problem List   Patient Active Problem List   Diagnosis Date Noted   Maternal varicella, non-immune 10/30/2023   Diet controlled gestational diabetes mellitus (GDM) in second trimester 10/30/2023   Supervision of high risk pregnancy, antepartum 10/30/2023   Language barrier 11/03/2019   History of gestational hypertension 09/16/2019   Maternal Fetal Medicine Consult Andrea Vargas is a 28 y.o. G3P2002 at [redacted]w[redacted]d here for ultrasound and consultation. She had low risk aneuploidy screening of a female fetus.She has no acute concerns.   Today we focused on the following:   Early onset GDM: I discussed the clinical and obstetric implications of this condition on pregnancy.  We discussed the importance of proper glycemic control.  Currently her blood sugars ranged tween 75 and 100 fasting with the majority less than 95 and all of her 2-hour postprandials are less than 120 with her diet alone.  We discussed that future ultrasounds are indicated to assess the fetal growth.  Antenatal testing is reserved for poor control reflected by abnormal glucose values requiring medication.  Delivery timing is typically between 39 and 40 weeks unless other complications arise.  There was no hemoglobin A1c to assess the long-term glucose control of this patient and therefore a fetal echo was not recommended.  However, an A1c should be collected and then if it is over 6.5 a fetal echo may be warranted.  History of gestational hypertension: I discussed the risk and impact of preeclampsia on her pregnancy and the role of baseline laboratory assessment of kidney, liver and platelet count as well as the role of low dose Aspirin  to the reduce the risk of developing preeclampsia.   I reassured the patient that we expect a favorable  pregnancy outcome but due to her pregnancy complications she will need a higher level of monitoring for her pregnancy compared to a pregnancy without complications. The patient had time to ask questions that were answered to her satisfaction. She verbalized understanding of our discussion and agreed to proceed with the plan outlined in the recommendations.   Sonographic findings Single intrauterine pregnancy at [redacted]w[redacted]d. Fetal cardiac activity:  Observed and appears normal. Presentation: Transverse, head to maternal left. The anatomic structures that were well seen appear normal without evidence of soft markers. The anatomic survey is complete.  Fetal biometry shows the estimated fetal weight at the 9 percentile. Amniotic fluid: Within normal limits.  MVP: 4.38 cm Placenta: Posterior. Adnexa: No abnormality visualized. Cervical length: 4.2 cm.  There are limitations of prenatal ultrasound such as the inability to detect certain abnormalities due to poor visualization. Various factors such as fetal position, gestational age and maternal body habitus may increase the difficulty in visualizing the fetal anatomy.    Recommendations The patient has a regular menstrual cycles and therefore the ultrasound today should be used for the EDD giving her an EDD of 04/29/2024  Recommendations - Aneuploidy screening was completed at the [facility] office - If not previously completed carrier screening should be ordered - A1c should be ordered to see if a fetal echo is indicated. If > 6.5 then a FE should be obtained.  - Anatomy ultrasound was done today with the above findings (see report). - Aspirin  81 mg continued throughout the pregnancy for preeclampsia prophylaxis. - Baseline labs: CMP, CBC, urine protein creatinine ratio if  not previously completed. Repeat with any concerns for preeclampsia. - Blood pressure goal of < 140 systolic and < 90 diastolic. Antihypertensive medication should be added/adjusted until BP  goal is achieved.  - Continue glucose assessment.  Currently well-controlled  - Serial growth ultrasounds every 4 weeks starting at 24 to 28 weeks until delivery. - Antenatal testing (usually weekly BPP or NST) weekly at 32 weeks until delivery only if medication is required to control her diabetes or if other indications arise. - Delivery likely around [redacted] weeks gestation or sooner if indicated.  An in person interpreter was used for today's visit  Review of Systems: A review of systems was performed and was negative except per HPI   Past Obstetrical History:  OB History  Gravida Para Term Preterm AB Living  3 2 2  0 0 2  SAB IAB Ectopic Multiple Live Births  0 0 0  2    # Outcome Date GA Lbr Len/2nd Weight Sex Type Anes PTL Lv  3 Current           2 Term 12/16/19 [redacted]w[redacted]d 02:56 / 00:39 7 lb 10.6 oz (3.475 kg) M Vag-Spont None  LIV  1 Term 08/26/14 [redacted]w[redacted]d   M Vag-Spont   LIV     Past Medical History:  Past Medical History:  Diagnosis Date   Diet controlled gestational diabetes mellitus (GDM) in third trimester 09/16/2019   GBS (group B Streptococcus carrier), +RV culture, currently pregnant 12/06/2019   Gestational diabetes    History of gestational diabetes mellitus (GDM) 10/30/2023   History of gestational hypertension    left fetal pyelectasis, antepartum 09/16/2019   7mm on 9/21     Left urinary tract dilation  F/u scheduled     Vaginal delivery 12/16/2019     Past Surgical History:    Past Surgical History:  Procedure Laterality Date   BREAST SURGERY     incision and drainage of breast     due to mastitis     Home Medications:   Current Outpatient Medications on File Prior to Visit  Medication Sig Dispense Refill   Prenatal Vit-Fe Fumarate-FA (PRENATAL VITAMINS PO) Take 1 tablet by mouth daily.     No current facility-administered medications on file prior to visit.      Allergies:   Allergies  Allergen Reactions   Dust Mite Extract Itching     Physical  Exam:   Vitals:   12/10/23 0809  BP: 110/71  Pulse: 90   Sitting comfortably on the sonogram table Nonlabored breathing Normal rate and rhythm Abdomen is nontender  Thank you for the opportunity to be involved with this patient's care. Please let us  know if we can be of any further assistance.   60 minutes of time was spent reviewing the patient's chart including labs, imaging and documentation.  At least 50% of this time was spent with direct patient care discussing the diagnosis, management and prognosis of her care.  Delora Smaller MFM, [facility]   12/10/2023  10:07 AM

## 2023-12-15 ENCOUNTER — Ambulatory Visit (INDEPENDENT_AMBULATORY_CARE_PROVIDER_SITE_OTHER): Payer: Self-pay | Admitting: Dietician

## 2023-12-15 ENCOUNTER — Encounter: Payer: MEDICAID | Attending: Family Medicine | Admitting: Dietician

## 2023-12-15 VITALS — Wt 136.0 lb

## 2023-12-15 DIAGNOSIS — Z713 Dietary counseling and surveillance: Secondary | ICD-10-CM | POA: Insufficient documentation

## 2023-12-15 DIAGNOSIS — O24410 Gestational diabetes mellitus in pregnancy, diet controlled: Secondary | ICD-10-CM

## 2023-12-15 DIAGNOSIS — O0992 Supervision of high risk pregnancy, unspecified, second trimester: Secondary | ICD-10-CM | POA: Insufficient documentation

## 2023-12-15 DIAGNOSIS — Z3A16 16 weeks gestation of pregnancy: Secondary | ICD-10-CM | POA: Insufficient documentation

## 2023-12-15 DIAGNOSIS — Z3A20 20 weeks gestation of pregnancy: Secondary | ICD-10-CM

## 2023-12-15 NOTE — Progress Notes (Signed)
 Patient was seen for Gestational Diabetes/Pre-existing Diabetes During Pregnancy) on 12/15/2023. She was last seen by another RD on 11/12/2023. Start time [phone] and End time 0948   Estimated due date: 04/30/2023; [redacted]w[redacted]d  136 lbs 12/15/2023 unchanged for 2 weeks. Overall glucose is well controlled.  Interpreter from [employer], Name:  Fenton #[id number]  Clinical: Medications: prenatal vitamin Medical History: GDM previous pregnancy, gestational HTN Labs:  Labs: OGTT fasting none, 1 hour 192, 2 hour 163, 3 hour 132 on 10/12/2023 Recent Labs  No results found for: HGBA1C   Dietary and Lifestyle History: Pt presents today alone. 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 Pt reports she does the shopping and cooking for a family of three. Pt reports previous GDM with her last pregnancy and states this was controlled with her diet successfully. Pt reports eating out once or less times weekly. Pt reports she had a snack of fruit and bread today before coming in to the office today. Pt denies intake of sugary sweetened beverages. All Pt's questions were answered during this encounter   Physical Activity: walking 10 minutes daily Stress: none Sleep: good  24 hr Recall:  First Meal:  2 eggs, almond milk, green juice (cucumber, chyote, lemon, spinach, nopales) Snack:  fruit Second meal:  meat or egg, vegetables Snack:  none Third meal:  chicken, 1 corn tortilla, broccoli Snack:  none Beverages:  water  NUTRITION INTERVENTION  Nutrition education (E-1) on the following topics:   Initial Follow-up  []  []  Definition of Gestational Diabetes []  []  Why dietary management is important in controlling blood glucose []  []  Effects each nutrient has on blood glucose levels []  []  Simple carbohydrates vs complex carbohydrates []  []  Fluid intake []  [x]  Creating a balanced meal plan []  []  Carbohydrate counting  []  []  When to check blood glucose levels []  []  Proper blood glucose monitoring  techniques []  []  Effect of stress and stress reduction techniques  []  []  Exercise effect on blood glucose levels, appropriate exercise during pregnancy []  []  Importance of limiting caffeine and abstaining from alcohol and smoking []  []  Medications used for blood sugar control during pregnancy []  []  Hypoglycemia and rule of 15 []  [x]  Postpartum self care  Blood glucose supplies given - strips and lancets Lot # [id number] A-4 Exp: 01/07/2025 More blood glucose logs.  Patient instructed to monitor glucose levels: FBS: 60 - <= 95 mg/dL; 2 hour: <= 879 mg/dL  Patient received handouts: initial visit Nutrition Diabetes and Pregnancy Carbohydrate Counting List Blood glucose log Snack ideas for diabetes during pregnancy  Patient will be seen for follow-up as needed.

## 2024-01-04 ENCOUNTER — Ambulatory Visit (INDEPENDENT_AMBULATORY_CARE_PROVIDER_SITE_OTHER): Payer: Self-pay | Admitting: Family Medicine

## 2024-01-04 VITALS — BP 95/61 | HR 88 | Wt 140.0 lb

## 2024-01-04 DIAGNOSIS — Z758 Other problems related to medical facilities and other health care: Secondary | ICD-10-CM

## 2024-01-04 DIAGNOSIS — Z3A23 23 weeks gestation of pregnancy: Secondary | ICD-10-CM

## 2024-01-04 DIAGNOSIS — O0990 Supervision of high risk pregnancy, unspecified, unspecified trimester: Secondary | ICD-10-CM

## 2024-01-04 DIAGNOSIS — O0992 Supervision of high risk pregnancy, unspecified, second trimester: Secondary | ICD-10-CM

## 2024-01-04 DIAGNOSIS — Z3490 Encounter for supervision of normal pregnancy, unspecified, unspecified trimester: Secondary | ICD-10-CM

## 2024-01-04 DIAGNOSIS — Z603 Acculturation difficulty: Secondary | ICD-10-CM

## 2024-01-04 DIAGNOSIS — Z2839 Other underimmunization status: Secondary | ICD-10-CM

## 2024-01-04 DIAGNOSIS — Z8759 Personal history of other complications of pregnancy, childbirth and the puerperium: Secondary | ICD-10-CM

## 2024-01-04 DIAGNOSIS — O24410 Gestational diabetes mellitus in pregnancy, diet controlled: Secondary | ICD-10-CM

## 2024-01-04 NOTE — Progress Notes (Signed)
 PRENATAL VISIT NOTE  Subjective:  Andrea Vargas is a 28 y.o. G3P2002 at [redacted]w[redacted]d being seen today for ongoing prenatal care.  She is currently monitored for the following issues for this high-risk pregnancy and has History of gestational hypertension; Language barrier; Maternal varicella, non-immune; Diet controlled gestational diabetes mellitus (GDM) in second trimester; and Supervision of high risk pregnancy, antepartum on their problem list.  Patient reports no bleeding, no contractions, no cramping, and no leaking.  Contractions: Not present. Vag. Bleeding: None.  Movement: Present. Denies leaking of fluid.   The following portions of the patient's history were reviewed and updated as appropriate: allergies, current medications, past family history, past medical history, past social history, past surgical history and problem list.   Objective:   Vitals:   01/04/24 1613  BP: 95/61  Pulse: 88  Weight: 140 lb (63.5 kg)    Fetal Status:  Fetal Heart Rate (bpm): 147   Movement: Present    General: Alert, oriented and cooperative. Patient is in no acute distress.  Skin: Skin is warm and dry. No rash noted.   Cardiovascular: Normal heart rate noted  Respiratory: Normal respiratory effort, no problems with respiration noted  Abdomen: Soft, gravid, appropriate for gestational age.  Pain/Pressure: Absent     Pelvic: Cervical exam deferred        Extremities: Normal range of motion.  Edema: None  Mental Status: Normal mood and affect. Normal behavior. Normal judgment and thought content.      11/04/2023   12:15 PM 12/15/2019    4:08 PM 12/08/2019    4:18 PM  Depression screen PHQ 2/9  Decreased Interest 0 0 0  Down, Depressed, Hopeless 0 0 0  PHQ - 2 Score 0 0 0  Altered sleeping 0 0 0  Tired, decreased energy 0 0 0  Change in appetite 0 0 0  Feeling bad or failure about yourself  0 0 0  Trouble concentrating 0 0 0  Moving slowly or fidgety/restless 0 0 0  Suicidal  thoughts 0 0 0  PHQ-9 Score 0 0 0        11/04/2023   12:15 PM 12/15/2019    4:08 PM 12/08/2019    4:18 PM 11/24/2019    8:55 AM  GAD 7 : Generalized Anxiety Score  Nervous, Anxious, on Edge 0 0 0 0  Control/stop worrying 0 0 0 0  Worry too much - different things 0 0 0 0  Trouble relaxing 0 0 0 0  Restless 0 0 0 0  Easily annoyed or irritable 0 0 0 0  Afraid - awful might happen 0 0 0 0  Total GAD 7 Score 0 0 0 0    Assessment and Plan:  Pregnancy: G3P2002 at [redacted]w[redacted]d 1. Supervision of high risk pregnancy, antepartum (Primary) FHR BP appropriate today  2. Diet controlled gestational diabetes mellitus (GDM) in second trimester Blood glucose is well-controlled Continue to monitor  3. Language barrier Spanish interpreter used for entire visit  4. History of gestational hypertension BP within normal limits  5. Maternal varicella, non-immune  6. [redacted] weeks gestation of pregnancy   Preterm labor symptoms and general obstetric precautions including but not limited to vaginal bleeding, contractions, leaking of fluid and fetal movement were reviewed in detail with the patient. Please refer to After Visit Summary for other counseling recommendations.   No follow-ups on file.  Future Appointments  Date Time Provider Department Center  01/14/2024  9:15 AM [facility] PROVIDER  1 [facility]  01/14/2024  9:30 AM [facility] US3 [facility]  02/11/2024  9:15 AM [facility] PROVIDER 1 [facility]  02/11/2024  9:30 AM [facility] US3 [facility]    Norleen LULLA Rover, MD

## 2024-01-14 ENCOUNTER — Ambulatory Visit: Payer: Self-pay | Attending: Maternal & Fetal Medicine

## 2024-01-14 ENCOUNTER — Other Ambulatory Visit: Payer: Self-pay | Admitting: *Deleted

## 2024-01-14 ENCOUNTER — Other Ambulatory Visit: Payer: Self-pay | Admitting: Maternal & Fetal Medicine

## 2024-01-14 ENCOUNTER — Ambulatory Visit: Payer: MEDICAID | Admitting: Obstetrics

## 2024-01-14 VITALS — BP 102/53 | HR 70

## 2024-01-14 DIAGNOSIS — Z3A24 24 weeks gestation of pregnancy: Secondary | ICD-10-CM

## 2024-01-14 DIAGNOSIS — O24410 Gestational diabetes mellitus in pregnancy, diet controlled: Secondary | ICD-10-CM | POA: Insufficient documentation

## 2024-01-14 DIAGNOSIS — Z3A25 25 weeks gestation of pregnancy: Secondary | ICD-10-CM | POA: Insufficient documentation

## 2024-01-14 DIAGNOSIS — O0990 Supervision of high risk pregnancy, unspecified, unspecified trimester: Secondary | ICD-10-CM | POA: Insufficient documentation

## 2024-01-14 DIAGNOSIS — Z3490 Encounter for supervision of normal pregnancy, unspecified, unspecified trimester: Secondary | ICD-10-CM | POA: Insufficient documentation

## 2024-01-14 DIAGNOSIS — O365921 Maternal care for other known or suspected poor fetal growth, second trimester, fetus 1: Secondary | ICD-10-CM

## 2024-01-14 DIAGNOSIS — O09292 Supervision of pregnancy with other poor reproductive or obstetric history, second trimester: Secondary | ICD-10-CM | POA: Insufficient documentation

## 2024-01-14 DIAGNOSIS — O365920 Maternal care for other known or suspected poor fetal growth, second trimester, not applicable or unspecified: Secondary | ICD-10-CM

## 2024-01-14 DIAGNOSIS — Z2839 Other underimmunization status: Secondary | ICD-10-CM | POA: Insufficient documentation

## 2024-01-14 NOTE — Progress Notes (Signed)
 MFM Consult Note  Andrea Vargas is currently at [redacted]w[redacted]d. She has been followed due to IUGR and early onset diet-controlled gestational diabetes.  She denies any problems since her last exam and reports feeling fetal movements throughout the day.  She also reports that her fingerstick values have been within normal limits.  Sonographic findings Single intrauterine pregnancy at 25w 4d. Fetal cardiac activity:  Observed and appears normal. Presentation: Transverse, head to maternal left. Fetal biometry shows the estimated fetal weight of 1 pound 9 oz which measures at the 7th percentile.  Amniotic fluid: Within normal limits.  MVP: 4.43 cm. Placenta: Posterior. Umbilical artery dopplers findings: -S/D:2.79 which are  at this gestational age.  -Absent end-diastolic flow: No.  -Reversed end-diastolic flow:  No.  Gestational Diabetes and Pregnancy  She was advised to continue to monitor her fingersticks 4 times daily (fasting and 2 hours after each meal).    She was advised that our goals for her fingerstick values are fasting values of 90-95 or less and two-hour postprandial values of 120 or less.    Should the majority of her fingerstick results be above these values, she may have to be started on insulin or metformin to help her achieve better glycemic control.   IUGR  She was advised regarding the increased risk of an adverse pregnancy outcome such as stillbirth associated with IUGR, especially if the umbilical artery Doppler studies are abnormal.    She was also reassured that fetal growth restriction is a common finding and that most cases result in the delivery of a healthy infant close to term.  She was advised to drink plenty of fluids and to increase periods of rest when she can.  She was also advised to continue to monitor for fetal movements.  Fetal kick count instructions were reviewed.  Due to IUGR, she will return in 3 weeks for another growth scan and umbilical  artery Doppler study.  As long as the umbilical artery Doppler studies continue to be normal, the fetal testing remains reassuring, and the fetus continues to show growth, we will try to delay delivery until 37-38 weeks.  All conversations were held with the patient today with the help of a Spanish interpreter.  The patient stated that all of her questions were answered.   A total of 20 minutes was spent counseling and coordinating the care for this patient.  Greater than 50% of the time was spent in direct face-to-face contact.

## 2024-02-02 ENCOUNTER — Ambulatory Visit: Payer: Self-pay | Admitting: Obstetrics and Gynecology

## 2024-02-02 ENCOUNTER — Encounter: Payer: Self-pay | Admitting: Obstetrics and Gynecology

## 2024-02-02 VITALS — BP 101/67 | HR 105 | Wt 144.5 lb

## 2024-02-02 DIAGNOSIS — O365990 Maternal care for other known or suspected poor fetal growth, unspecified trimester, not applicable or unspecified: Secondary | ICD-10-CM | POA: Insufficient documentation

## 2024-02-02 DIAGNOSIS — O24410 Gestational diabetes mellitus in pregnancy, diet controlled: Secondary | ICD-10-CM

## 2024-02-02 DIAGNOSIS — Z3A27 27 weeks gestation of pregnancy: Secondary | ICD-10-CM

## 2024-02-02 DIAGNOSIS — O0990 Supervision of high risk pregnancy, unspecified, unspecified trimester: Secondary | ICD-10-CM

## 2024-02-02 DIAGNOSIS — Z8759 Personal history of other complications of pregnancy, childbirth and the puerperium: Secondary | ICD-10-CM

## 2024-02-02 DIAGNOSIS — Z603 Acculturation difficulty: Secondary | ICD-10-CM

## 2024-02-02 DIAGNOSIS — O365930 Maternal care for other known or suspected poor fetal growth, third trimester, not applicable or unspecified: Secondary | ICD-10-CM

## 2024-02-02 DIAGNOSIS — Z2839 Other underimmunization status: Secondary | ICD-10-CM

## 2024-02-02 MED ORDER — PRODIGY NO CODING BLOOD GLUC VI STRP: ORAL_STRIP | 12 refills | Status: DC

## 2024-02-02 NOTE — Progress Notes (Signed)
 PRENATAL VISIT NOTE  Subjective:  Andrea Vargas is a 28 y.o. G3P2002 at [redacted]w[redacted]d being seen today for ongoing prenatal care.  She is currently monitored for the following issues for this high-risk pregnancy and has History of gestational hypertension; Language barrier; Maternal varicella, non-immune; Diet controlled gestational diabetes mellitus (GDM) in second trimester; Supervision of high risk pregnancy, antepartum; and IUGR (intrauterine growth restriction) affecting care of mother on their problem list.  Patient reports no complaints.  Contractions: Not present. Vag. Bleeding: None.  Movement: Present. Denies leaking of fluid.   The following portions of the patient's history were reviewed and updated as appropriate: allergies, current medications, past family history, past medical history, past social history, past surgical history and problem list.   Objective:   Vitals:   02/02/24 1540  BP: 101/67  Pulse: (!) 105  Weight: 144 lb 8 oz (65.5 kg)    Fetal Status:  Fetal Heart Rate (bpm): 138   Movement: Present    General: Alert, oriented and cooperative. Patient is in no acute distress.  Skin: Skin is warm and dry. No rash noted.   Cardiovascular: Normal heart rate noted  Respiratory: Normal respiratory effort, no problems with respiration noted  Abdomen: Soft, gravid, appropriate for gestational age.  Pain/Pressure: Absent     Pelvic: Cervical exam deferred        Extremities: Normal range of motion.  Edema: None  Mental Status: Normal mood and affect. Normal behavior. Normal judgment and thought content.      11/04/2023   12:15 PM 12/15/2019    4:08 PM 12/08/2019    4:18 PM  Depression screen PHQ 2/9  Decreased Interest 0 0 0  Down, Depressed, Hopeless 0 0 0  PHQ - 2 Score 0 0 0  Altered sleeping 0 0 0  Tired, decreased energy 0 0 0  Change in appetite 0 0 0  Feeling bad or failure about yourself  0 0 0  Trouble concentrating 0 0 0  Moving slowly or  fidgety/restless 0 0 0  Suicidal thoughts 0 0 0  PHQ-9 Score 0  0  0      Data saved with a previous flowsheet row definition        11/04/2023   12:15 PM 12/15/2019    4:08 PM 12/08/2019    4:18 PM 11/24/2019    8:55 AM  GAD 7 : Generalized Anxiety Score  Nervous, Anxious, on Edge 0 0 0 0  Control/stop worrying 0 0 0 0  Worry too much - different things 0 0 0 0  Trouble relaxing 0 0 0 0  Restless 0 0 0 0  Easily annoyed or irritable 0 0 0 0  Afraid - awful might happen 0 0 0 0  Total GAD 7 Score 0 0 0 0    Assessment and Plan:  Pregnancy: G3P2002 at [redacted]w[redacted]d  1. History of gestational hypertension (Primary)  2. Diet controlled gestational diabetes mellitus (GDM) in second trimester Reviewed log, most of fasting are 95, all are 95 or less Pp CBG are all < 120 Reviewed medications, particularly if fasting remain borderline, she is agreeable  3. Language barrier Engineer, structural used  4. Maternal varicella, non-immune Consider varicella pp  5. Supervision of high risk pregnancy, antepartum 3rd trim labs today  6. [redacted] weeks gestation of pregnancy  7. Poor fetal growth affecting management of mother in third trimester, single or unspecified fetus Normal dopplers Last growth 7th%tile Has f/u on 02/05/24  Reviewed delivery planning, likely 38 weeks if normal dopplers and IUGR persists   Preterm labor symptoms and general obstetric precautions including but not limited to vaginal bleeding, contractions, leaking of fluid and fetal movement were reviewed in detail with the patient. Please refer to After Visit Summary for other counseling recommendations.   Return in about 2 weeks (around 02/16/2024) for high OB.  Future Appointments  Date Time Provider Department Center  02/05/2024  3:15 PM [facility] PROVIDER 1 [facility]  02/05/2024  3:30 PM [facility] US5 [facility]  02/11/2024  9:15 AM [facility] PROVIDER 1 [facility]  02/11/2024  9:30 AM [facility] US3 [facility]   02/11/2024 10:45 AM [facility] NST [facility]    Burnard CHRISTELLA Moats, MD

## 2024-02-03 LAB — CBC
Hematocrit: 31.4 % — ABNORMAL LOW (ref 34.0–46.6)
Hemoglobin: 10.5 g/dL — ABNORMAL LOW (ref 11.1–15.9)
MCH: 30.4 pg (ref 26.6–33.0)
MCHC: 33.4 g/dL (ref 31.5–35.7)
MCV: 91 fL (ref 79–97)
Platelets: 280 x10E3/uL (ref 150–450)
RBC: 3.45 x10E6/uL — ABNORMAL LOW (ref 3.77–5.28)
RDW: 13 % (ref 11.7–15.4)
WBC: 8.9 x10E3/uL (ref 3.4–10.8)

## 2024-02-03 LAB — ABO AND RH: Rh Factor: POSITIVE

## 2024-02-03 LAB — SYPHILIS: RPR W/REFLEX TO RPR TITER AND TREPONEMAL ANTIBODIES, TRADITIONAL SCREENING AND DIAGNOSIS ALGORITHM: RPR Ser Ql: NONREACTIVE

## 2024-02-03 LAB — HIV ANTIBODY (ROUTINE TESTING W REFLEX): HIV Screen 4th Generation wRfx: NONREACTIVE

## 2024-02-04 ENCOUNTER — Ambulatory Visit: Payer: Self-pay | Admitting: Obstetrics and Gynecology

## 2024-02-05 ENCOUNTER — Other Ambulatory Visit: Payer: Self-pay | Admitting: *Deleted

## 2024-02-05 ENCOUNTER — Ambulatory Visit: Payer: Self-pay | Attending: Obstetrics

## 2024-02-05 ENCOUNTER — Ambulatory Visit: Payer: Self-pay | Admitting: Obstetrics and Gynecology

## 2024-02-05 VITALS — BP 107/56

## 2024-02-05 DIAGNOSIS — O365920 Maternal care for other known or suspected poor fetal growth, second trimester, not applicable or unspecified: Secondary | ICD-10-CM

## 2024-02-05 DIAGNOSIS — Z2839 Other underimmunization status: Secondary | ICD-10-CM | POA: Diagnosis present

## 2024-02-05 DIAGNOSIS — O24410 Gestational diabetes mellitus in pregnancy, diet controlled: Secondary | ICD-10-CM

## 2024-02-05 DIAGNOSIS — Z3490 Encounter for supervision of normal pregnancy, unspecified, unspecified trimester: Secondary | ICD-10-CM | POA: Diagnosis present

## 2024-02-05 DIAGNOSIS — Z3A28 28 weeks gestation of pregnancy: Secondary | ICD-10-CM

## 2024-02-05 DIAGNOSIS — Z362 Encounter for other antenatal screening follow-up: Secondary | ICD-10-CM | POA: Diagnosis not present

## 2024-02-05 DIAGNOSIS — O09293 Supervision of pregnancy with other poor reproductive or obstetric history, third trimester: Secondary | ICD-10-CM | POA: Diagnosis not present

## 2024-02-05 DIAGNOSIS — O0990 Supervision of high risk pregnancy, unspecified, unspecified trimester: Secondary | ICD-10-CM | POA: Diagnosis not present

## 2024-02-05 NOTE — Progress Notes (Signed)
 Maternal-Fetal Medicine Consultation  Name: Zakaria Fromer  MRN: [id number]  GA: [id number] 106w0d   Patient returns for fetal assessment. -Fetal growth restriction.  On ultrasound performed 3 weeks ago, the estimated fetal weight was at the 7th percentile. - Gestational diabetes.  Well-controlled on diet.  Obstetrical history significant for 2 term vaginal deliveries.  Ultrasound Normal fetal growth and amniotic fluid.  The estimated fetal weight is at the 52nd percentile.  Cephalic presentation.  Umbilical artery Doppler showed normal forward diastolic flow.  Gestational diabetes I reviewed her blood glucose log and both fasting and postprandial levels are within normal range.  I encouraged her to check her blood glucose regularly.  If diabetes is not well-controlled on diet, oral hypoglycemics or insulin will be considered. If diabetes is well-controlled on diet, delivery can be planned at 32- or 40-weeks' gestation.  I have reassured the patient that fetal growth restriction had resolved.  However, she will be having serial fetal growth assessments because of gestational diabetes and that will provide us  an opportunity to rule out fetal growth restriction.  Recommendations Fetal growth assessment in 4 weeks.     Consultation including face-to-face (more than 50%) counseling 20 minutes.

## 2024-02-05 NOTE — Progress Notes (Signed)
 Pt reports highest fast blood glucose this past week:  95 Pt reports highest 2hr after meal blood sugar past week:  115

## 2024-02-11 ENCOUNTER — Ambulatory Visit: Payer: MEDICAID

## 2024-02-11 ENCOUNTER — Ambulatory Visit: Payer: MEDICAID | Admitting: Obstetrics and Gynecology

## 2024-02-11 ENCOUNTER — Ambulatory Visit: Payer: Self-pay

## 2024-02-11 VITALS — BP 104/60 | HR 81

## 2024-02-11 DIAGNOSIS — O0990 Supervision of high risk pregnancy, unspecified, unspecified trimester: Secondary | ICD-10-CM | POA: Insufficient documentation

## 2024-02-11 DIAGNOSIS — O24410 Gestational diabetes mellitus in pregnancy, diet controlled: Secondary | ICD-10-CM

## 2024-02-11 DIAGNOSIS — Z8759 Personal history of other complications of pregnancy, childbirth and the puerperium: Secondary | ICD-10-CM | POA: Insufficient documentation

## 2024-02-11 DIAGNOSIS — O365930 Maternal care for other known or suspected poor fetal growth, third trimester, not applicable or unspecified: Secondary | ICD-10-CM | POA: Insufficient documentation

## 2024-02-11 DIAGNOSIS — Z3A28 28 weeks gestation of pregnancy: Secondary | ICD-10-CM | POA: Insufficient documentation

## 2024-02-11 NOTE — Progress Notes (Signed)
 Maternal-Fetal Medicine Consultation  Name: Andrea Vargas  MRN: [id number]  GA: [id number] [redacted]w[redacted]d   Patient is here for antenatal testing.  Previously seen fetal growth restriction had resolved at ultrasound performed last week. She has gestational diabetes.  Previous external hospital admission.    Ultrasound Normal amniotic fluid.  Umbilical artery Doppler showed normal forward diastolic flow.  Cephalic presentation.    Gestational diabetes I reviewed her blood glucose log, and they are within normal range.  I encouraged her to check her blood glucose regularly.  Recommendations -Patient has an appointment for follow-up fetal growth assessment in 3 weeks.   Consultation including face-to-face (more than 50%) counseling 10 minutes.

## 2024-02-15 ENCOUNTER — Other Ambulatory Visit: Payer: Self-pay | Admitting: *Deleted

## 2024-02-15 DIAGNOSIS — O0990 Supervision of high risk pregnancy, unspecified, unspecified trimester: Secondary | ICD-10-CM

## 2024-02-15 DIAGNOSIS — O24410 Gestational diabetes mellitus in pregnancy, diet controlled: Secondary | ICD-10-CM

## 2024-02-15 MED ORDER — PRODIGY NO CODING BLOOD GLUC VI STRP: ORAL_STRIP | 12 refills | Status: AC

## 2024-02-15 NOTE — Telephone Encounter (Signed)
 Received fax requesting clarification on test strip RX. New rx sent in. Rock Skip PEAK

## 2024-02-22 ENCOUNTER — Ambulatory Visit (INDEPENDENT_AMBULATORY_CARE_PROVIDER_SITE_OTHER): Payer: Self-pay | Admitting: Family Medicine

## 2024-02-22 ENCOUNTER — Other Ambulatory Visit: Payer: Self-pay

## 2024-02-22 VITALS — BP 102/66 | HR 85 | Wt 144.3 lb

## 2024-02-22 DIAGNOSIS — Z3A30 30 weeks gestation of pregnancy: Secondary | ICD-10-CM

## 2024-02-22 DIAGNOSIS — Z23 Encounter for immunization: Secondary | ICD-10-CM

## 2024-02-22 DIAGNOSIS — Z603 Acculturation difficulty: Secondary | ICD-10-CM

## 2024-02-22 DIAGNOSIS — O0990 Supervision of high risk pregnancy, unspecified, unspecified trimester: Secondary | ICD-10-CM

## 2024-02-22 DIAGNOSIS — Z758 Other problems related to medical facilities and other health care: Secondary | ICD-10-CM

## 2024-02-22 DIAGNOSIS — O0993 Supervision of high risk pregnancy, unspecified, third trimester: Secondary | ICD-10-CM

## 2024-02-22 DIAGNOSIS — Z8759 Personal history of other complications of pregnancy, childbirth and the puerperium: Secondary | ICD-10-CM

## 2024-02-22 DIAGNOSIS — O24410 Gestational diabetes mellitus in pregnancy, diet controlled: Secondary | ICD-10-CM

## 2024-02-22 DIAGNOSIS — Z2839 Other underimmunization status: Secondary | ICD-10-CM

## 2024-02-22 MED ORDER — FERROUS SULFATE 325 (65 FE) MG PO TBEC: 325.0000 mg | DELAYED_RELEASE_TABLET | ORAL | 2 refills | Status: AC

## 2024-02-22 NOTE — Progress Notes (Unsigned)
 "  PRENATAL VISIT NOTE  Subjective:  Andrea Vargas is a 28 y.o. G3P2002 at [redacted]w[redacted]d being seen today for ongoing prenatal care.  She is currently monitored for the following issues for this {Blank single:19197::high-risk,low-risk} pregnancy and has History of gestational hypertension; Language barrier; Maternal varicella, non-immune; Diet controlled gestational diabetes mellitus (GDM) in second trimester; Supervision of high risk pregnancy, antepartum; and IUGR (intrauterine growth restriction) affecting care of mother on their problem list.  Patient reports {sx:[id number]}.  Contractions: Irritability. Vag. Bleeding: None.  Movement: Present. Denies leaking of fluid.   The following portions of the patient's history were reviewed and updated as appropriate: allergies, current medications, past family history, past medical history, past social history, past surgical history and problem list.   Objective:   Vitals:   02/22/24 1334  BP: 102/66  Pulse: 85  Weight: 144 lb 4.8 oz (65.5 kg)    Fetal Status:  Fetal Heart Rate (bpm): 147   Movement: Present    General: Alert, oriented and cooperative. Patient is in no acute distress.  Skin: Skin is warm and dry. No rash noted.   Cardiovascular: Normal heart rate noted  Respiratory: Normal respiratory effort, no problems with respiration noted  Abdomen: Soft, gravid, appropriate for gestational age.  Pain/Pressure: Present     Pelvic: {Blank single:19197::Cervical exam performed in the presence of a chaperone,Cervical exam deferred}        Extremities: Normal range of motion.  Edema: None  Mental Status: Normal mood and affect. Normal behavior. Normal judgment and thought content.      11/04/2023   12:15 PM 12/15/2019    4:08 PM 12/08/2019    4:18 PM  Depression screen PHQ 2/9  Decreased Interest 0 0 0  Down, Depressed, Hopeless 0 0 0  PHQ - 2 Score 0 0 0  Altered sleeping 0 0 0  Tired, decreased energy 0 0 0  Change in  appetite 0 0 0  Feeling bad or failure about yourself  0 0 0  Trouble concentrating 0 0 0  Moving slowly or fidgety/restless 0 0 0  Suicidal thoughts 0 0 0  PHQ-9 Score 0  0  0      Data saved with a previous flowsheet row definition        11/04/2023   12:15 PM 12/15/2019    4:08 PM 12/08/2019    4:18 PM 11/24/2019    8:55 AM  GAD 7 : Generalized Anxiety Score  Nervous, Anxious, on Edge 0 0 0 0  Control/stop worrying 0 0 0 0  Worry too much - different things 0 0 0 0  Trouble relaxing 0 0 0 0  Restless 0 0 0 0  Easily annoyed or irritable 0 0 0 0  Afraid - awful might happen 0 0 0 0  Total GAD 7 Score 0 0 0 0    Assessment and Plan:  Pregnancy: G3P2002 at [redacted]w[redacted]d 1. Supervision of high risk pregnancy, antepartum (Primary) *** - Tdap vaccine greater than or equal to 7yo IM  2. Diet controlled gestational diabetes mellitus (GDM) in second trimester ***  3. History of gestational hypertension ***  4. Language barrier ***  5. Maternal varicella, non-immune ***  6. [redacted] weeks gestation of pregnancy ***  {Blank single:19197::Term,Preterm} labor symptoms and general obstetric precautions including but not limited to vaginal bleeding, contractions, leaking of fluid and fetal movement were reviewed in detail with the patient. Please refer to After Visit Summary for other counseling recommendations.  No follow-ups on file.  Future Appointments  Date Time Provider Department Center  03/10/2024  1:15 PM [facility] PROVIDER 1 [facility]  03/10/2024  1:30 PM [facility] US4 [facility]  03/10/2024  2:35 PM Anyanwu, Gloris LABOR, MD [facility]  03/24/2024  1:15 PM Nicholaus Burnard HERO, MD [facility]  04/07/2024 10:15 AM Herchel Gloris LABOR, MD [facility]  04/14/2024 10:55 AM [facility] 1 [facility]  04/21/2024 11:15 AM [facility] 1 [facility]  04/28/2024 11:15 AM [facility] 1 [facility]    Norleen LULLA Rover, MD  "

## 2024-03-10 ENCOUNTER — Other Ambulatory Visit: Payer: Self-pay

## 2024-03-10 ENCOUNTER — Ambulatory Visit (HOSPITAL_BASED_OUTPATIENT_CLINIC_OR_DEPARTMENT_OTHER): Payer: Self-pay | Admitting: Obstetrics

## 2024-03-10 ENCOUNTER — Ambulatory Visit: Payer: Self-pay | Attending: Obstetrics and Gynecology

## 2024-03-10 ENCOUNTER — Ambulatory Visit: Payer: Self-pay | Admitting: Family Medicine

## 2024-03-10 ENCOUNTER — Other Ambulatory Visit: Payer: Self-pay | Admitting: *Deleted

## 2024-03-10 VITALS — BP 105/75 | HR 118 | Wt 145.0 lb

## 2024-03-10 VITALS — BP 101/61 | HR 124

## 2024-03-10 DIAGNOSIS — O24410 Gestational diabetes mellitus in pregnancy, diet controlled: Secondary | ICD-10-CM | POA: Insufficient documentation

## 2024-03-10 DIAGNOSIS — Z3A33 33 weeks gestation of pregnancy: Secondary | ICD-10-CM

## 2024-03-10 DIAGNOSIS — O09293 Supervision of pregnancy with other poor reproductive or obstetric history, third trimester: Secondary | ICD-10-CM

## 2024-03-10 DIAGNOSIS — Z8759 Personal history of other complications of pregnancy, childbirth and the puerperium: Secondary | ICD-10-CM

## 2024-03-10 DIAGNOSIS — Z603 Acculturation difficulty: Secondary | ICD-10-CM

## 2024-03-10 DIAGNOSIS — O0993 Supervision of high risk pregnancy, unspecified, third trimester: Secondary | ICD-10-CM

## 2024-03-10 DIAGNOSIS — Z758 Other problems related to medical facilities and other health care: Secondary | ICD-10-CM

## 2024-03-10 DIAGNOSIS — Z3A32 32 weeks gestation of pregnancy: Secondary | ICD-10-CM | POA: Insufficient documentation

## 2024-03-10 DIAGNOSIS — O0990 Supervision of high risk pregnancy, unspecified, unspecified trimester: Secondary | ICD-10-CM

## 2024-03-10 DIAGNOSIS — O365930 Maternal care for other known or suspected poor fetal growth, third trimester, not applicable or unspecified: Secondary | ICD-10-CM | POA: Insufficient documentation

## 2024-03-10 NOTE — Progress Notes (Signed)
 MFM Consult Note  Andrea Vargas is currently at [redacted]w[redacted]d. She has been followed due to diet-controlled gestational diabetes.    She reports that the majority of her fingerstick values have mostly been within normal limits.    She denies any problems since her last exam and reports feeling fetal movements throughout the day.    IUGR was noted earlier in her pregnancy.  Her blood pressure today was 101/61.  Sonographic findings Single intrauterine pregnancy at 33w 4d.  Fetal cardiac activity:  Observed and appears normal. Presentation: Cephalic. Fetal biometry shows the estimated fetal weight of 5 lb 9 oz,  2519g (79%). Amniotic fluid volume: Within normal limits. AFI: 17.34cm.  MVP: 6.66 cm. Placenta: Posterior Fundal.  The patient was reassured that the fetus is no longer growth restricted.    She was advised to continue monitoring her fingerstick values and bring her logs with her to all of her future prenatal appointments.    Due to gestational diabetes, delivery is recommended at around 39 weeks.    A follow-up growth scan was scheduled in 4 weeks.  The patient stated that all of her questions were answered.   All conversations were held with the patient today with the help of a Spanish interpreter.  A total of 20 minutes was spent counseling and coordinating the care for this patient.  Greater than 50% of the time was spent in direct face-to-face contact.

## 2024-03-10 NOTE — Patient Instructions (Addendum)
 Las medicinas seguras para tomar academic librarian  Safe Medications in Pregnancy  Acn:  Benzoyl Peroxide (Perxido de benzolo)  Salicylic Acid (cido saliclico)  Dolor de espalda/Dolor de cabeza:  Tylenol : 2 pastillas de concentracin regular cada 4 horas O 2 pastillas de concentracin fuerte cada 6 horas  Resfriados/Tos/Alergias:  Benadryl  (sin alcohol) 25 mg cada 6 horas segn lo necesite Breath Right strips (Tiras para respirar correctamente)  Claritin  Cepacol (pastillas de chupar para la garganta)  Chloraseptic (aerosol para la garganta)  Cold-Eeze- hasta tres veces por da  Cough drops (pastillas de chupar para la tos, sin alcohol)  Flonase (con receta mdica solamente)  Guaifenesin  Mucinex  Robitussin DM (simple solamente, sin alcohol)  Saline nasal spray/drops (Aerosol nasal salino/gotas) Sudafed (pseudoephedrine) y  Actifed * utilizar slo despus de 12 semanas de gestacin y si no tiene la presin arterial alta.  Tylenol  Vicks  VapoRub  Zinc lozenges (pastillas para la garganta)  Zyrtec  Estreimiento:  Colace  Ducolax (supositorios)  Fleet enema (lavado intestinal rectal)  Glycerin  (supositorios)  Metamucil  Milk of magnesia (leche de magnesia)  Miralax  Senokot  Smooth Move (t)  Diarrea:  Kaopectate Imodium A-D  *NO tome Pepto-Bismol  Hemorroides:  Anusol  Anusol HC  Preparation H  Tucks  Indigestin:  Tums  Maalox  Mylanta  Zantac  Pepcid  Insomnia:  Benadryl  (sin alcohol) 25mg  cada 6 horas segn lo necesite  Tylenol  PM  Unisom, no Gelcaps  Calambres en las piernas:  Tums  MagGel Nuseas/Vmitos:  Bonine  Dramamine  Emetrol  Ginger (extracto)  Sea-Bands  Meclizine  Medicina para las nuseas que puede tomar durante el embarazo: Unisom (doxylamine succinate, pastillas de 25 mg) Tome una pastilla al da al [place]. Si los sntomas no estn adecuadamente controlados, la dosis puede aumentarse hasta una dosis mxima recomendada de american international group al da (1/2 pastilla por la [place], 1/2 pastilla a media tarde y ignacia pastilla al [place]). Pastillas de Vitamina B6 de 100mg . Tome conagra foods veces al da (hasta 200 mg por da).  Erupciones en la piel:  Productos de Aveeno  Benadryl  cream (crema o una dosis de 25mg  cada 6 horas segn lo necesite)  Calamine Lotion (locin)  1% cortisone cream (crema de cortisona de 1%)  nfeccin vaginal por hongos (candidiasis):  Gyne-lotrimin  7  Monistat 7   **Si est tomando varias medicinas, por favor revise las etiquetas para art gallery manager los mismos ingredientes activos. **Tome la medicina segn lo indicado en la etiqueta. **No tome ms de 400 mg de Tylenol  en 24 horas. **No tome medicinas que contengan aspirina o ibuprofeno.       Tercer trimestre de psychiatrist Third Trimester of Pregnancy  El tercer trimestre de embarazo va desde la semana 28 hasta la semana 40. Esto corresponde a los meses 7 a 9. El tercer trimestre es un perodo en el que el beb crece rpido. Cambios en el cuerpo durante el tercer trimestre Su cuerpo contina cambiando durante este perodo. En general, los cambios desaparecen despus del nacimiento del beb. Cambios fsicos Usted seguir aumentando de [place]. Podrn aparecer estras en las caderas, la barriga y las mamas. Las conagra foods seguirn creciendo y pueden dolerle. Un lquido amarillo (calostro) puede salir de sus pechos. Esta es la primera leche que usted produce para el beb. El cabello puede crecerle ms rpido y engrosarse. En algunos casos, puede haber cada del cabello. El ombligo puede salir hacia afuera. Puede observar que se le hinchan  ms las manos, la cara o los tobillos. Cambios en la salud Es posible que tenga acidez estomacal. Es posible que sienta que le falta el aire. La causa de esto es que el tero ahora es ms grande. Puede presentar ms dolor en la pelvis, la espalda o los muslos. Puede presentar ms hormigueo o entumecimiento en las  manos, los brazos y las piernas. Es posible que orine con mayor frecuencia. Puede tener dificultad para defecar (estreimiento) o venas hinchadas en el ano que pueden picar o doler (hemorroides). Otros cambios Puede tener ms problemas para dormir. Puede notar que el beb se mueve ms hacia bajo adentro de la barriga [place]). Puede ser que le salga ms lquido de la vagina. Puede sentir las articulaciones flojas y puede sentir dolor alrededor del hueso plvico. Siga estas instrucciones en su casa: Medicamentos Use los medicamentos solamente como se lo haya indicado el mdico. Algunos medicamentos no son seguros durante el embarazo. El mdico puede cambiar los medicamentos que usa . No use ningn medicamento a menos que se lo haya indicado el mdico. Tome vitaminas prenatales que tengan por lo menos 600 microgramos (mcg) de cido flico. No consuma medicamentos a base de hierbas, drogas ilegales, ni medicamentos que el mdico no haya autorizado. Comida y bebida Durante el embarazo, [place] cuerpo necesita nutricin adicional para brindar sustento al beb que est creciendo. Hable con su mdico sobre sus necesidades nutricionales. Actividad La mayora de las mujeres pueden hacer ejercicio regularmente durante el embarazo. Las rutinas de ejercicio pueden tener que cambiar en la etapa final del [place]. Hable con su mdico sobre sus actividades y rutina de ejercicio. Alivio del dolor y del malestar Descanse a menudo y con las piernas elevadas si tiene educational psychologist en las piernas o dolor de patent attorney. Tome baos de asiento tibios para engineer, materials de las hemorroides. Use una crema para las hemorroides si el mdico se lo permite. Use un sostn que le brinde buen soporte si le duelen las mamas. No se d baos de inmersin en agua caliente, baos turcos ni saunas. No se haga duchas vaginales. No use tampones ni protectores de ropa interior perfumados. Seguridad Hable con su mdico antes de viajar  distancias largas. Use el cinturn de seguridad en todo momento mientras vaya en auto. Hable con su mdico si alguien la golpea, la lastima o le grita. Preparacin para el nacimiento Preparacin para recibir al beb: Tome clases para prepararse para el parto y patent examiner. Visite el hospital y recorra el rea de maternidad. Compre un asiento de seguridad trw automotive atrs para llevar al beb en el automvil. Aprenda cmo instalarlo en el auto. Instrucciones generales Evite el contacto con las bandejas sanitarias de los gatos y la tierra que estos animales usan. Estos objetos tienen grmenes que puedenperjudicar el embarazo y el beb. No beba alcohol, no fume, no vapee ni consuma productos que tengan nicotina o tabaco. Si necesita ayuda para dejar de fumar, hable con su mdico. Asista a todas las visitas de seguimiento del tercer trimestre. El mdico le har ms exmenes y estudios durante este trimestre. Escriba sus preguntas. Llvelas cuando concurra a las visitas prenatales. El mdico tambin: Hablar con usted sobre su salud general. Ladora dar consejos o la derivar a especialistas que puedan ayudarla con diferentes necesidades, entre ellas: Salud mental y psicoterapia. Alimentos y alimentacin saludable. Si necesita ayuda con alimentos, pdala. Dnde buscar ms informacin American Pregnancy Association (Asociacin Americana del Embarazo): americanpregnancy.org [employer] of Obstetricians and Gynecologists (Colegio Echostar  de Obstetras y Gineclogos): acog.org Office on [employer] (Oficina para la Salud de la Mujer): travellesson.ca Comunquese con un mdico si: Tiene un dolor de cabeza que no desaparece despus de science writer. Tiene alguno de estos problemas: No puede comer ni beber. Tiene nuseas y vmitos. Hace deposiciones acuosas (diarrea) durante 2 o ms das. Siente dolor al orinar o hace orina con mal olor. Se ha sentido enferma durante 2 o ms das y  no mejora. Comunquese con su mdico de inmediato si: Alguna de estas sustancias emana de la vagina: Secrecin anormal. Lquido con mal olor. Sangrado. El beb se mueve menos de lo habitual. Presenta signos de [place] de parto: Tiene contracciones, clicos en la barriga o dolor en la pelvis o la parte baja de la espalda antes de las 37 semanas de embarazo (trabajo de parto prematuro). Tiene contracciones regulares separadas por menos de 5 minutos. Rompe la bolsa. Tiene sntomas de presin arterial alta o preeclampsia. Esto incluye lo siguiente: Dolor de cabeza intenso y punzante que no desaparece. Hinchazn sbita o extrema del rostro, las manos, las piernas o los pies. Problemas de visin: Ve manchas. Tiene la visin borrosa. Los ojos tienen sensibilidad a statistician. Si no puede comunicarse con su mdico, acuda a una sala de atencin de urgencias o emergencias. Solicite ayuda de inmediato si: Se desmaya, se siente confundida o no puede pensar con claridad. Tiene dolor en el pecho o dificultad para respirar. Sufre cualquier tipo de lesin, por ejemplo, debido a una cada o un accidente automovilstico. Estos sntomas pueden indicar una emergencia. Llame al 911 de inmediato. No espere a ver si los sntomas desaparecen. No conduzca por sus propios medios officemax incorporated. Esta informacin no tiene theme park manager el consejo del mdico. Asegrese de hacerle al mdico cualquier pregunta que tenga. Document Revised: 07/24/2022 Document Reviewed: 07/24/2022 Elsevier Patient Education  2024 Elsevier Inc. Opciones de mtodos anticonceptivos Birth Control Options Los mtodos anticonceptivos tambin se denominan anticonceptivos. Los anticonceptivos previenen el embarazo. Hay muchos tipos de anticonceptivos. Trabaje con el mdico para encontrar la opcin ms adecuada para usted. Anticonceptivos que utilizan hormonas Estos tipos de anticonceptivos contienen hormonas para prevenir el  embarazo. Implante anticonceptivo Este es un pequeo tubo que se coloca dentro de la piel del brazo. El tubo insurance claims handler colocado durante 3 aos como mximo. Inyeccin anticonceptiva Son inyecciones que se aplican cada 3 meses. Pldoras anticonceptivas Esta es una pldora que se toma todos [place]. Debe tomarla a la smith international. Parche anticonceptivo Este es un parche que se coloca sobre la piel. Se debe cambiar 1 vez por semana durante 3 semanas. Despus de sysco, el parche se debe retirar durante 1 semana. Anillo vaginal  Este es un anillo de plstico blando que se coloca dentro de la vagina. El anillo se deja colocado durante 3 semanas. Luego, se debe retirar durante 1 semana. Despus se coloca un nuevo anillo. Mtodos de barrera  Preservativo masculino Es una cubierta delgada que se coloca sobre el pene antes de [place]. El preservativo se desecha despus de doctor, hospital. Preservativo femenino Es una cubierta blanda y suelta que se coloca en la vagina antes de [place]. El preservativo se desecha despus de doctor, hospital. Diafragma El diafragma es una barrera blanda con forma de tazn. Debe estar hecho para adaptarse a su cuerpo. Se coloca en la vagina antes de tener sexo con una sustancia qumica que destruye los espermatozoides llamada espermicida. El diafragma debe  permanecer en la vagina durante 6 a 8 horas despus de tener sexo y debe retirarse en un plazo de 24 horas. El diafragma se debe reemplazar: Cada 1 o 2 aos. Despus de dar a [place]. Despus de aumentar ms de 15 libras (6.8 kg). Si se somete a una ciruga en la pelvis. Capuchn cervical Este es un capuchn pequeo y [place] se fija sobre el cuello uterino. El cuello uterino es la parte ms baja del tero. Se coloca en la vagina antes del sexo, junto con un espermicida. El capuchn debe fabricarse para usted. El capuchn se debe dejar colocado durante 6 a 8 horas despus del sexo. Se debe retirar en  un plazo de 48 horas. El capuchn cervical debe ser recetado y adaptado a su cuerpo por un mdico. Debe reemplazarse cada 2 aos. Esponja Esta es una esponja pequea que se coloca en la vagina antes de [place]. Se debe dejar colocada durante al menos 6 horas despus de ebay. Se debe retirar en un plazo de 30 horas y desecharse. Espermicidas Son sustancias qumicas que destruyen o impiden que los espermatozoides ingresen al tero. Se pueden presentar en forma de pldora, crema, gel o espuma que se debe colocar en la vagina. Se deben usar al menos de 10 a 15 minutos antes de ebay. Dispositivo intrauterino Un dispositivo intrauterino (DIU) es un dispositivo que un mdico coloca dentro del tero. Existen dos tipos: DIU hormonal. Este tipo puede permanecer colocado durante 3 a 5 aos. DIU de cobre. Este tipo puede permanecer colocado durante 10 aos. Mtodos anticonceptivos permanentes Ligadura de trompas en la mujer Es una ciruga para obstruir las trompas de [place]. Esterilizacin masculina Es una ciruga, llamada vasectoma, para ligar los conductos que transportan los espermatozoides en los hombres. Este mtodo funciona al cabo de 3 meses. Se deben usar otros mtodos anticonceptivos durante 3 meses. Mtodos de planificacin natural Esto significa no tener family dollar stores la pareja femenina podra quedar embarazada. A continuacin se mencionan algunos mtodos anticonceptivos por planificacin natural: Usar un calendario a fin de: Hacer un seguimiento de la duracin de cada ciclo menstrual. Determinar en h. j. heinz se podra producir el embarazo. Planificar no tener [employer] corporation en que se podra producir firefighter. Reconocer los signos de la ovulacin y no tener relaciones sexuales durante ese perodo. La pareja femenina puede detectar cundo ser la ovulacin haciendo un seguimiento de su temperatura todos [place]. Tambin puede examinar si hay cambios en la mucosidad que  proviene del cuello uterino. Dnde obtener ms informacin Centers for Disease Control and Prevention (Centros para el Control y la Prevencin de Enfermedades): tonerpromos.no. Luego: Introduzca birth control o anticonceptivos en el cuadro de bsqueda. Esta informacin no tiene theme park manager el consejo del mdico. Asegrese de hacerle al mdico cualquier pregunta que tenga. Document Revised: 10/24/2022 Document Reviewed: 10/24/2022 Elsevier Patient Education  2024 [employer].

## 2024-03-10 NOTE — Progress Notes (Signed)
" ° °  Subjective:  Andrea Vargas is a 29 y.o. G3P2002 at [redacted]w[redacted]d being seen today for ongoing prenatal care.  She is currently monitored for the following issues for this high-risk pregnancy and has History of gestational hypertension; Language barrier; Maternal varicella, non-immune; Diet controlled gestational diabetes mellitus (GDM) in second trimester; and Supervision of high risk pregnancy, antepartum on their problem list.  Patient reports no complaints.  Contractions: Not present. Vag. Bleeding: None.  Movement: Present. Denies leaking of fluid.   The following portions of the patient's history were reviewed and updated as appropriate: allergies, current medications, past family history, past medical history, past social history, past surgical history and problem list. Problem list updated.  Objective:   Vitals:   03/10/24 1449  BP: 105/75  Pulse: (!) 118  Weight: 145 lb (65.8 kg)    Fetal Status: Fetal Heart Rate (bpm): 148   Movement: Present     General:  Alert, oriented and cooperative. Patient is in no acute distress.  Skin: Skin is warm and dry. No rash noted.   Cardiovascular: Normal heart rate noted  Respiratory: Normal respiratory effort, no problems with respiration noted  Abdomen: Soft, gravid, appropriate for gestational age. Pain/Pressure: Absent     Pelvic: Vag. Bleeding: None     Cervical exam deferred        Extremities: Normal range of motion.  Edema: None  Mental Status: Normal mood and affect. Normal behavior. Normal judgment and thought content.   Urinalysis:        Assessment and Plan:  Pregnancy: G3P2002 at [redacted]w[redacted]d  1. [redacted] weeks gestation of pregnancy (Primary)   2. Supervision of high risk pregnancy, antepartum BP and FHR normal Discussed contraception, she is planning on copper IUD, reviewed she will need to have it placed PP [facility] due to no insurance Reporting she felt hot last night and her throat has been hurting a little. Given safe  meds in pregnancy list and also advised to be careful as there are many cases of Flu A at present.  3. Diet controlled gestational diabetes mellitus (GDM) in second trimester Log reviewed, see above Well controlled on diet Last growth US  early today, EFW 2519g 79%, AFI 17 cm  4. History of gestational hypertension Normotensive, on ASA  5. Language barrier Spanish  Preterm labor symptoms and general obstetric precautions including but not limited to vaginal bleeding, contractions, leaking of fluid and fetal movement were reviewed in detail with the patient. Please refer to After Visit Summary for other counseling recommendations.  Return in 2 weeks (on 03/24/2024) for ob visit, [facility].   Lola Donnice HERO, MD "

## 2024-03-24 ENCOUNTER — Other Ambulatory Visit: Payer: Self-pay

## 2024-03-24 ENCOUNTER — Encounter: Payer: Self-pay | Admitting: Obstetrics and Gynecology

## 2024-03-24 ENCOUNTER — Ambulatory Visit: Payer: Self-pay | Admitting: Obstetrics and Gynecology

## 2024-03-24 VITALS — BP 99/74 | HR 98 | Wt 146.0 lb

## 2024-03-24 DIAGNOSIS — Z8759 Personal history of other complications of pregnancy, childbirth and the puerperium: Secondary | ICD-10-CM

## 2024-03-24 DIAGNOSIS — O0990 Supervision of high risk pregnancy, unspecified, unspecified trimester: Secondary | ICD-10-CM

## 2024-03-24 DIAGNOSIS — O24410 Gestational diabetes mellitus in pregnancy, diet controlled: Secondary | ICD-10-CM

## 2024-03-24 DIAGNOSIS — O0993 Supervision of high risk pregnancy, unspecified, third trimester: Secondary | ICD-10-CM

## 2024-03-24 DIAGNOSIS — Z758 Other problems related to medical facilities and other health care: Secondary | ICD-10-CM

## 2024-03-24 DIAGNOSIS — Z603 Acculturation difficulty: Secondary | ICD-10-CM

## 2024-03-24 DIAGNOSIS — Z2839 Other underimmunization status: Secondary | ICD-10-CM

## 2024-03-24 DIAGNOSIS — Z3A34 34 weeks gestation of pregnancy: Secondary | ICD-10-CM

## 2024-03-24 DIAGNOSIS — Z2911 Encounter for prophylactic immunotherapy for respiratory syncytial virus (RSV): Secondary | ICD-10-CM

## 2024-03-24 NOTE — Progress Notes (Signed)
 "  PRENATAL VISIT NOTE  Subjective:  Andrea Vargas is a 29 y.o. G3P2002 at [redacted]w[redacted]d being seen today for ongoing prenatal care.  She is currently monitored for the following issues for this high-risk pregnancy and has History of gestational hypertension; Language barrier; Maternal varicella, non-immune; Diet controlled gestational diabetes mellitus (GDM) in second trimester; and Supervision of high risk pregnancy, antepartum on their problem list.  Patient reports no complaints.  Contractions: Irritability. Vag. Bleeding: None.  Movement: Present. Denies leaking of fluid.   The following portions of the patient's history were reviewed and updated as appropriate: allergies, current medications, past family history, past medical history, past social history, past surgical history and problem list.   Objective:   Vitals:   03/24/24 1326  BP: 99/74  Pulse: 98  Weight: 146 lb (66.2 kg)    Fetal Status:  Fetal Heart Rate (bpm): 146   Movement: Present    General: Alert, oriented and cooperative. Patient is in no acute distress.  Skin: Skin is warm and dry. No rash noted.   Cardiovascular: Normal heart rate noted  Respiratory: Normal respiratory effort, no problems with respiration noted  Abdomen: Soft, gravid, appropriate for gestational age.  Pain/Pressure: Absent     Pelvic: Cervical exam deferred        Extremities: Normal range of motion.  Edema: None  Mental Status: Normal mood and affect. Normal behavior. Normal judgment and thought content.      11/04/2023   12:15 PM 12/15/2019    4:08 PM 12/08/2019    4:18 PM  Depression screen PHQ 2/9  Decreased Interest 0 0 0  Down, Depressed, Hopeless 0 0 0  PHQ - 2 Score 0 0 0  Altered sleeping 0 0 0  Tired, decreased energy 0 0 0  Change in appetite 0 0 0  Feeling bad or failure about yourself  0 0 0  Trouble concentrating 0 0 0  Moving slowly or fidgety/restless 0 0 0  Suicidal thoughts 0 0 0  PHQ-9 Score 0  0  0      Data  saved with a previous flowsheet row definition        11/04/2023   12:15 PM 12/15/2019    4:08 PM 12/08/2019    4:18 PM 11/24/2019    8:55 AM  GAD 7 : Generalized Anxiety Score  Nervous, Anxious, on Edge 0  0  0  0   Control/stop worrying 0  0  0  0   Worry too much - different things 0  0  0  0   Trouble relaxing 0  0  0  0   Restless 0  0  0  0   Easily annoyed or irritable 0  0  0  0   Afraid - awful might happen 0  0  0  0   Total GAD 7 Score 0 0 0 0     Data saved with a previous flowsheet row definition    Assessment and Plan:  Pregnancy: G3P2002 at [redacted]w[redacted]d 1. History of gestational hypertension (Primary) normotensive  2. Language barrier Engineer, structural used  3. Maternal varicella, non-immune Vaccinate pp  4. Supervision of high risk pregnancy, antepartum Counseled regarding risks/benefits of RSV vaccine, patient accepts vaccine.   5. Diet controlled gestational diabetes mellitus (GDM) in second trimester Reviewed log, all pp are within range [place] are mostly within range though she does have several borderline Cont diet control F/u growth US  on 2/6  Reviewed delivery planning   6. [redacted] weeks gestation of pregnancy   Preterm labor symptoms and general obstetric precautions including but not limited to vaginal bleeding, contractions, leaking of fluid and fetal movement were reviewed in detail with the patient. Please refer to After Visit Summary for other counseling recommendations.   Return in about 2 weeks (around 04/07/2024) for high OB.  Future Appointments  Date Time Provider Department Center  04/07/2024 10:15 AM Herchel Gloris LABOR, MD [facility]  04/08/2024  1:15 PM [facility] PROVIDER 1 [facility]  04/08/2024  1:30 PM [facility] US2 [facility]  04/14/2024 10:55 AM Zina Jerilynn LABOR, MD [facility]  04/21/2024 11:15 AM Eldonna Suzen Octave, MD [facility]  04/28/2024 11:15 AM Eveline Lynwood MATSU, MD [facility]    Burnard CHRISTELLA Moats, MD  "

## 2024-04-07 ENCOUNTER — Ambulatory Visit: Payer: Self-pay | Admitting: Obstetrics & Gynecology

## 2024-04-07 ENCOUNTER — Encounter: Payer: Self-pay | Admitting: Obstetrics & Gynecology

## 2024-04-07 ENCOUNTER — Other Ambulatory Visit (HOSPITAL_COMMUNITY): Admission: RE | Admit: 2024-04-07 | Payer: Self-pay | Source: Ambulatory Visit

## 2024-04-07 VITALS — BP 103/70 | HR 112 | Wt 146.0 lb

## 2024-04-07 DIAGNOSIS — Z3A36 36 weeks gestation of pregnancy: Secondary | ICD-10-CM

## 2024-04-07 DIAGNOSIS — O24410 Gestational diabetes mellitus in pregnancy, diet controlled: Secondary | ICD-10-CM

## 2024-04-07 DIAGNOSIS — Z8759 Personal history of other complications of pregnancy, childbirth and the puerperium: Secondary | ICD-10-CM

## 2024-04-07 DIAGNOSIS — O0990 Supervision of high risk pregnancy, unspecified, unspecified trimester: Secondary | ICD-10-CM

## 2024-04-07 NOTE — Progress Notes (Signed)
 "    PRENATAL VISIT NOTE  Patient is Spanish-speaking only, interpreter ([employer] interpreter Dorise [phone]) present for this encounter.  Subjective:  Andrea Vargas is a 29 y.o. G3P2002 at [redacted]w[redacted]d being seen today for ongoing prenatal care.  She is currently monitored for the following issues for this high-risk pregnancy and has History of gestational hypertension; Language barrier; Maternal varicella, non-immune; Diet controlled gestational diabetes mellitus (GDM) in third trimester; and Supervision of high risk pregnancy, antepartum on their problem list.  Patient reports no complaints.  Contractions: Irritability. Vag. Bleeding: None.  Movement: Present. Denies leaking of fluid.   The following portions of the patient's history were reviewed and updated as appropriate: allergies, current medications, past family history, past medical history, past social history, past surgical history and problem list.   Objective:  Chaperone Olivia Pendleton, RN   Vitals:   04/07/24 1022  BP: 103/70  Pulse: (!) 112  Weight: 146 lb (66.2 kg)   Fetal Status:  Fetal Heart Rate (bpm): 145   Movement: Present Presentation: Vertex  General: Alert, oriented and cooperative. Patient is in no acute distress.  Skin: Skin is warm and dry. No rash noted.   Cardiovascular: Normal heart rate noted  Respiratory: Normal respiratory effort, no problems with respiration noted  Abdomen: Soft, gravid, appropriate for gestational age.  Pain/Pressure: Present     Pelvic: Cervical exam performed in the presence of a chaperone Dilation: 1 Effacement (%): 50 Station: -3, cultures obtained  Extremities: Normal range of motion.  Edema: None  Mental Status: Normal mood and affect. Normal behavior. Normal judgment and thought content.      11/04/2023   12:15 PM 12/15/2019    4:08 PM 12/08/2019    4:18 PM  Depression screen PHQ 2/9  Decreased Interest 0 0 0  Down, Depressed, Hopeless 0 0 0  PHQ - 2 Score 0 0 0  Altered  sleeping 0 0 0  Tired, decreased energy 0 0 0  Change in appetite 0 0 0  Feeling bad or failure about yourself  0 0 0  Trouble concentrating 0 0 0  Moving slowly or fidgety/restless 0 0 0  Suicidal thoughts 0 0 0  PHQ-9 Score 0  0  0      Data saved with a previous flowsheet row definition        11/04/2023   12:15 PM 12/15/2019    4:08 PM 12/08/2019    4:18 PM 11/24/2019    8:55 AM  GAD 7 : Generalized Anxiety Score  Nervous, Anxious, on Edge 0  0  0  0   Control/stop worrying 0  0  0  0   Worry too much - different things 0  0  0  0   Trouble relaxing 0  0  0  0   Restless 0  0  0  0   Easily annoyed or irritable 0  0  0  0   Afraid - awful might happen 0  0  0  0   Total GAD 7 Score 0 0 0 0     Data saved with a previous flowsheet row definition    Assessment and Plan:  Pregnancy: G3P2002 at [redacted]w[redacted]d 1. Diet controlled gestational diabetes mellitus (GDM) in third trimester (Primary) Blood sugar log checked, sugars within range. Delivery indicated by 40 weeks. Growth scan scheduled on 04/14/24, will follow up results and manage accordingly.  2. History of gestational hypertension Stable BP  3. [redacted]  weeks gestation of pregnancy 4. Supervision of high risk pregnancy, antepartum Cultures done, will follow up results and manage accordingly. - Cervicovaginal ancillary only - Culture, beta strep (group b only) Preterm labor symptoms and general obstetric precautions including but not limited to vaginal bleeding, contractions, leaking of fluid and fetal movement were reviewed in detail with the patient. Please refer to After Visit Summary for other counseling recommendations.   Return in about 1 week (around 04/14/2024) for OFFICE OB VISIT (MD only).  Future Appointments  Date Time Provider Department Center  04/14/2024 10:55 AM Zina Jerilynn LABOR, MD [facility]  04/14/2024  2:15 PM [facility] PROVIDER 1 [facility]  04/14/2024  2:30 PM [facility] US5 [facility]  04/21/2024 11:15 AM  Eldonna Suzen Octave, MD [facility]  04/28/2024 11:15 AM Eveline Lynwood MATSU, MD [facility]    Gloris Hugger, MD  "

## 2024-04-08 ENCOUNTER — Ambulatory Visit: Payer: Self-pay

## 2024-04-08 LAB — CERVICOVAGINAL ANCILLARY ONLY
Chlamydia: NEGATIVE
Comment: NEGATIVE
Comment: NORMAL
Neisseria Gonorrhea: NEGATIVE

## 2024-04-14 ENCOUNTER — Ambulatory Visit: Payer: Self-pay

## 2024-04-14 ENCOUNTER — Encounter: Payer: Self-pay | Admitting: Obstetrics and Gynecology

## 2024-04-21 ENCOUNTER — Encounter: Payer: Self-pay | Admitting: Family Medicine

## 2024-04-28 ENCOUNTER — Encounter: Payer: Self-pay | Admitting: Obstetrics & Gynecology
